# Patient Record
Sex: Female | Born: 1950 | Race: White | Hispanic: No | Marital: Married | Smoking: Former smoker
Health system: Southern US, Community
[De-identification: ages and names within clinical notes are randomized; demographics above are authoritative.]

## PROBLEM LIST (undated history)

## (undated) DIAGNOSIS — M329 Systemic lupus erythematosus, unspecified: Secondary | ICD-10-CM

## (undated) DIAGNOSIS — J449 Chronic obstructive pulmonary disease, unspecified: Secondary | ICD-10-CM

## (undated) DIAGNOSIS — K589 Irritable bowel syndrome without diarrhea: Secondary | ICD-10-CM

## (undated) DIAGNOSIS — E162 Hypoglycemia, unspecified: Secondary | ICD-10-CM

## (undated) DIAGNOSIS — E079 Disorder of thyroid, unspecified: Secondary | ICD-10-CM

## (undated) DIAGNOSIS — K219 Gastro-esophageal reflux disease without esophagitis: Secondary | ICD-10-CM

## (undated) DIAGNOSIS — IMO0002 Reserved for concepts with insufficient information to code with codable children: Secondary | ICD-10-CM

## (undated) HISTORY — PX: CHOLECYSTECTOMY: SHX55

## (undated) HISTORY — PX: BREAST SURGERY: SHX581

## (undated) HISTORY — PX: ABDOMINAL HYSTERECTOMY: SHX81

## (undated) HISTORY — PX: CYSTECTOMY: SUR359

---

## 2014-06-28 ENCOUNTER — Encounter (HOSPITAL_COMMUNITY): Payer: Self-pay

## 2014-06-28 ENCOUNTER — Emergency Department (HOSPITAL_COMMUNITY)
Admission: EM | Admit: 2014-06-28 | Discharge: 2014-06-28 | Disposition: A | Discharge status: Home / Self Care | Payer: Commercial Managed Care - PPO | Attending: Emergency Medicine | Admitting: Emergency Medicine

## 2014-06-28 ENCOUNTER — Emergency Department (HOSPITAL_COMMUNITY): Payer: Commercial Managed Care - PPO

## 2014-06-28 DIAGNOSIS — K219 Gastro-esophageal reflux disease without esophagitis: Secondary | ICD-10-CM | POA: Diagnosis not present

## 2014-06-28 DIAGNOSIS — Z880 Allergy status to penicillin: Secondary | ICD-10-CM | POA: Diagnosis not present

## 2014-06-28 DIAGNOSIS — E039 Hypothyroidism, unspecified: Secondary | ICD-10-CM | POA: Insufficient documentation

## 2014-06-28 DIAGNOSIS — Z87891 Personal history of nicotine dependence: Secondary | ICD-10-CM | POA: Insufficient documentation

## 2014-06-28 DIAGNOSIS — Z8739 Personal history of other diseases of the musculoskeletal system and connective tissue: Secondary | ICD-10-CM | POA: Insufficient documentation

## 2014-06-28 DIAGNOSIS — Z7982 Long term (current) use of aspirin: Secondary | ICD-10-CM | POA: Diagnosis not present

## 2014-06-28 DIAGNOSIS — Z9049 Acquired absence of other specified parts of digestive tract: Secondary | ICD-10-CM | POA: Insufficient documentation

## 2014-06-28 DIAGNOSIS — Z90710 Acquired absence of both cervix and uterus: Secondary | ICD-10-CM | POA: Insufficient documentation

## 2014-06-28 DIAGNOSIS — J449 Chronic obstructive pulmonary disease, unspecified: Secondary | ICD-10-CM | POA: Insufficient documentation

## 2014-06-28 DIAGNOSIS — Z79899 Other long term (current) drug therapy: Secondary | ICD-10-CM | POA: Insufficient documentation

## 2014-06-28 DIAGNOSIS — R1032 Left lower quadrant pain: Secondary | ICD-10-CM | POA: Diagnosis present

## 2014-06-28 DIAGNOSIS — K625 Hemorrhage of anus and rectum: Secondary | ICD-10-CM | POA: Diagnosis not present

## 2014-06-28 HISTORY — DX: Hypoglycemia, unspecified: E16.2

## 2014-06-28 HISTORY — DX: Systemic lupus erythematosus, unspecified: M32.9

## 2014-06-28 HISTORY — DX: Gastro-esophageal reflux disease without esophagitis: K21.9

## 2014-06-28 HISTORY — DX: Chronic obstructive pulmonary disease, unspecified: J44.9

## 2014-06-28 HISTORY — DX: Reserved for concepts with insufficient information to code with codable children: IMO0002

## 2014-06-28 HISTORY — DX: Irritable bowel syndrome, unspecified: K58.9

## 2014-06-28 HISTORY — DX: Disorder of thyroid, unspecified: E07.9

## 2014-06-28 LAB — URINE MICROSCOPIC-ADD ON: RBC / HPF: NONE SEEN RBC/hpf (ref <3)

## 2014-06-28 LAB — CBC
HCT: 40.1 % (ref 36.0–46.0)
HEMOGLOBIN: 12.9 g/dL (ref 12.0–15.0)
MCH: 27.4 pg (ref 26.0–34.0)
MCHC: 32.2 g/dL (ref 30.0–36.0)
MCV: 85.3 fL (ref 78.0–100.0)
Platelets: 204 10*3/uL (ref 150–400)
RBC: 4.7 MIL/uL (ref 3.87–5.11)
RDW: 13.2 % (ref 11.5–15.5)
WBC: 7.4 10*3/uL (ref 4.0–10.5)

## 2014-06-28 LAB — COMPREHENSIVE METABOLIC PANEL
ALK PHOS: 82 U/L (ref 38–126)
ALT: 28 U/L (ref 14–54)
AST: 27 U/L (ref 15–41)
Albumin: 4.1 g/dL (ref 3.5–5.0)
Anion gap: 8 (ref 5–15)
BILIRUBIN TOTAL: 0.5 mg/dL (ref 0.3–1.2)
BUN: 27 mg/dL — ABNORMAL HIGH (ref 6–20)
CO2: 27 mmol/L (ref 22–32)
Calcium: 9.4 mg/dL (ref 8.9–10.3)
Chloride: 105 mmol/L (ref 101–111)
Creatinine, Ser: 0.76 mg/dL (ref 0.44–1.00)
Glucose, Bld: 89 mg/dL (ref 65–99)
Potassium: 3.8 mmol/L (ref 3.5–5.1)
Sodium: 140 mmol/L (ref 135–145)
Total Protein: 6.9 g/dL (ref 6.5–8.1)

## 2014-06-28 LAB — ABO/RH: ABO/RH(D): A NEG

## 2014-06-28 LAB — URINALYSIS, ROUTINE W REFLEX MICROSCOPIC
BILIRUBIN URINE: NEGATIVE
Glucose, UA: NEGATIVE mg/dL
KETONES UR: NEGATIVE mg/dL
Leukocytes, UA: NEGATIVE
NITRITE: NEGATIVE
PROTEIN: NEGATIVE mg/dL
Specific Gravity, Urine: 1.007 (ref 1.005–1.030)
Urobilinogen, UA: 0.2 mg/dL (ref 0.0–1.0)
pH: 5 (ref 5.0–8.0)

## 2014-06-28 LAB — TYPE AND SCREEN
ABO/RH(D): A NEG
Antibody Screen: NEGATIVE

## 2014-06-28 LAB — I-STAT CG4 LACTIC ACID, ED: Lactic Acid, Venous: 0.54 mmol/L (ref 0.5–2.0)

## 2014-06-28 MED ORDER — IOHEXOL 300 MG/ML  SOLN
80.0000 mL | Freq: Once | INTRAMUSCULAR | Status: AC | PRN
Start: 2014-06-28 — End: 2014-06-28
  Administered 2014-06-28: 80 mL via INTRAVENOUS

## 2014-06-28 MED ORDER — IOHEXOL 300 MG/ML  SOLN
25.0000 mL | Freq: Once | INTRAMUSCULAR | Status: AC | PRN
  Administered 2014-06-28: 25 mL via ORAL

## 2014-06-28 NOTE — ED Provider Notes (Signed)
CSN: [id number]     Arrival date & time 06/28/14  1158 History   First MD Initiated Contact with Patient 06/28/14 1314     Chief Complaint  Patient presents with  . Rectal Bleeding     (Consider location/radiation/quality/duration/timing/severity/associated sxs/prior Treatment) Patient is a 64 y.o. female presenting with hematochezia. The history is provided by the patient.  Rectal Bleeding Quality:  Bright red and maroon Amount:  Moderate Duration: 3 episodes. Timing:  Intermittent Progression:  Resolved Chronicity:  New Context: hemorrhoids   Context: not constipation, not diarrhea, not rectal injury and not rectal pain   Similar prior episodes: no   Relieved by:  None tried Worsened by:  Nothing tried Ineffective treatments:  None tried Associated symptoms: abdominal pain   Associated symptoms: no dizziness, no fever, no hematemesis, no light-headedness, no loss of consciousness, no recent illness and no vomiting   Abdominal pain:    Location:  LLQ   Quality:  Dull   Severity:  Mild   Onset quality:  Gradual   Duration:  1 day   Timing:  Constant   Progression:  Unchanged   Chronicity:  New Risk factors: no anticoagulant use     Past Medical History  Diagnosis Date  . IBS (irritable bowel syndrome)   . COPD (chronic obstructive pulmonary disease)   . GERD (gastroesophageal reflux disease)   . Lupus   . Thyroid disease     hypothyroid  . Hypoglycemia    Past Surgical History  Procedure Laterality Date  . Cholecystectomy    . Abdominal hysterectomy    . Cystectomy    . Breast surgery Left     lump removed   No family history on file. History  Substance Use Topics  . Smoking status: Former Games developer  . Smokeless tobacco: Not on file  . Alcohol Use: No   OB History    No data available     Review of Systems  Constitutional: Negative for fever, chills, diaphoresis, appetite change and fatigue.  Respiratory: Negative for cough, chest tightness and  shortness of breath.   Cardiovascular: Negative for chest pain, palpitations and leg swelling.  Gastrointestinal: Positive for abdominal pain, blood in stool and hematochezia. Negative for nausea, vomiting, diarrhea, constipation, abdominal distention, rectal pain and hematemesis.  Genitourinary: Negative for dysuria, frequency, hematuria, flank pain and difficulty urinating.  Musculoskeletal: Negative for back pain, neck pain and neck stiffness.  Skin: Negative for color change, pallor and rash.  Neurological: Negative for dizziness, loss of consciousness, syncope, weakness, light-headedness, numbness and headaches.  All other systems reviewed and are negative.     Allergies  Penicillins  Home Medications   Prior to Admission medications   Medication Sig Start Date End Date Taking? Authorizing Provider  aspirin 325 MG tablet Take 325 mg by mouth daily.   Yes Historical Provider, MD  hydroxychloroquine (PLAQUENIL) 200 MG tablet Take 200 mg by mouth daily.   Yes Historical Provider, MD  hydrOXYzine (ATARAX/VISTARIL) 25 MG tablet Take 25 mg by mouth 2 (two) times daily.   Yes Historical Provider, MD  lansoprazole (PREVACID) 30 MG capsule Take 30 mg by mouth daily at 12 noon.   Yes Historical Provider, MD  levothyroxine (SYNTHROID, LEVOTHROID) 100 MCG tablet Take 100 mcg by mouth daily before breakfast.   Yes Historical Provider, MD  MAGNESIUM PO Take 1 tablet by mouth daily.   Yes Historical Provider, MD  Multiple Vitamins-Minerals (MULTIVITAMIN GUMMIES ADULT PO) Take 1 tablet by mouth daily.  Yes Historical Provider, MD  OVER THE COUNTER MEDICATION Take 2 tablets by mouth daily.   Yes Historical Provider, MD  simvastatin (ZOCOR) 40 MG tablet Take 40 mg by mouth daily.   Yes Historical Provider, MD   BP 120/55 mmHg  Pulse 77  Temp(Src) 97.8 F (36.6 C) (Oral)  Resp 20  Ht 5\' 4"  (1.626 m)  Wt 155 lb (70.308 kg)  BMI 26.59 kg/m2  SpO2 98% Physical Exam  Constitutional: She is  oriented to person, place, and time. She appears well-developed and well-nourished. No distress.  HENT:  Head: Normocephalic and atraumatic.  Mouth/Throat: Oropharynx is clear and moist.  Eyes: Conjunctivae and EOM are normal. Pupils are equal, round, and reactive to light.  Neck: Normal range of motion. Neck supple.  Cardiovascular: Normal rate, regular rhythm, normal heart sounds and intact distal pulses.  Exam reveals no gallop and no friction rub.   No murmur heard. Pulmonary/Chest: Effort normal and breath sounds normal. No respiratory distress. She has no wheezes. She has no rales.  Abdominal: Soft. Normal appearance and bowel sounds are normal. There is tenderness in the left lower quadrant. There is no rigidity, no rebound, no guarding, no CVA tenderness, no tenderness at McBurney's point and negative Murphy's sign.  Genitourinary: Rectal exam shows external hemorrhoid. Rectal exam shows no fissure and no tenderness.  external hemorrhoids present without fissures, thrombosed hemorrhoids or TTP Pt declines DRE  Musculoskeletal: Normal range of motion. She exhibits no edema or tenderness.  Neurological: She is alert and oriented to person, place, and time. GCS eye subscore is 4. GCS verbal subscore is 5. GCS motor subscore is 6.  Skin: Skin is warm and dry. No rash noted. She is not diaphoretic. No erythema. No pallor.  Nursing note and vitals reviewed.   ED Course  Procedures (including critical care time) Labs Review Labs Reviewed  COMPREHENSIVE METABOLIC PANEL - Abnormal; Notable for the following:    BUN 27 (*)    All other components within normal limits  URINALYSIS, ROUTINE W REFLEX MICROSCOPIC - Abnormal; Notable for the following:    Hgb urine dipstick SMALL (*)    All other components within normal limits  CBC  URINE MICROSCOPIC-ADD ON  I-STAT CG4 LACTIC ACID, ED  POC OCCULT BLOOD, ED  TYPE AND SCREEN  ABO/RH    Imaging Review Ct Abdomen Pelvis W  Contrast  06/28/2014   CLINICAL DATA:  Lower abdominal pain.  Bloody stools today.  EXAM: CT ABDOMEN AND PELVIS WITH CONTRAST  TECHNIQUE: Multidetector CT imaging of the abdomen and pelvis was performed using the standard protocol following bolus administration of intravenous contrast.  CONTRAST:  100 mL OMNIPAQUE IOHEXOL 300 MG/ML  SOLN  COMPARISON:  None.  FINDINGS: Gallbladder has been removed. Liver parenchyma is normal. Slight dilatation of the biliary tree with a maximum diameter of the common bile duct of 10 mm, normal for a postcholecystectomy patient.  The spleen, pancreas, adrenal glands, and right kidney are normal. Left kidney is normal except for a 1.7 cm cyst on the lower pole.  There are few small diverticula in the distal sigmoid portion of the colon. The bowel otherwise appears normal including the terminal ileum and appendix. There is moderate calcification in the abdominal aorta. Uterus and right ovary have been removed. Normal left ovary. Bladder appears normal. No adenopathy or mass lesions. No free air or free fluid. No acute osseous abnormality.  IMPRESSION: No acute abnormalities. Minimal diverticulosis of the distal sigmoid colon.  Electronically Signed   By: Francene BoyersJames  Maxwell M.D.   On: 06/28/2014 16:21     EKG Interpretation None      MDM   Final diagnoses:  Rectal bleed    64 yo F with PMH of IBS, hemorrhoids, COPD, hypothyroidism, lupus, presenting with rectal bleeding.  Pt reports 3 episodes of bloody stools today, described as bright red blood mixed in with stool.  No diarrhea, melena.  +associated LLQ abdominal pain.  Denies fevers, N/V/D, dysuria.  Denies lightheadedness, chest pain, SOB. Reports she has not had issues with hemorrhoids in several years since she started taking a stool softener.  Feels different from when she had hemorrhoids as she was not straining, had no rectal pain, and did not feel them "pop out" when stooling.  Denies anti-coagulant use.  On  presentation, pt alert, AFVSS, in NAD. CV and lung exam WNL.  Abdomen soft, mild TTP in LLQ without peritoneal signs. Pt declined DRE but willing to have external rectal exam performed- external hemorrhoids present without fissures, thrombosed hemorrhoids or TTP. No other acute findings.  Possible diverticulitis or diverticulosis, vs infectious or inflammatory colitis.  Plan for labs, CT A/P.  Lab results WNL, no anemia, normal lactate and electrolytes.  16:00:  Care of pt to Dr. Arlie SolomonsPost, CT A/P pending.  Stable at time of transfer.  Discussed with attending Dr. Lynelle DoctorKnapp.    Jodean LimaEmily Waynesha Rammel, MD 06/28/14 [id number]  Linwood DibblesJon Knapp, MD 07/01/14 [phone]

## 2014-06-28 NOTE — ED Notes (Signed)
Ct notified that pt finished drinking her contrast.

## 2014-06-28 NOTE — ED Notes (Signed)
MD at bedside. 

## 2014-06-28 NOTE — ED Notes (Signed)
Pt has noticed this morning she had three bloody stools. The first one was bright red and only noticed it on the tissue paper, the second and third was dark red and noticed it in the bowl. Has IBS and hemorrhoids and takes a medication to soften her stools and has never had any problems with her stools from the medications. Has some lower abd pain. [place]

## 2014-06-28 NOTE — ED Notes (Signed)
Patient transported to CT 

## 2014-06-28 NOTE — ED Provider Notes (Signed)
4:13 PM Patient care transfer from Dr. Artis FlockWolfe. 64 year old female with 3 episodes of bloody diarrhea this afternoon. Does have some mild abdominal discomfort however physical exam is otherwise unremarkable. Labs showed normal hemoglobin. CMP unremarkable. Awaiting CT abdomen and pelvis to rule out diverticulitis or other.  4:49 PM CT abdomen and pelvis without any significant findings. Did have some diverticulosis however however no diverticulitis. Reevaluation patient continues to be stable. No significant abdominal pain on my exam. We will recommend patient follow up with her PCP for a colonoscopy within the next several days. Patient is from [place] We've also provided her number for physicians in this area if she will be here for longer term. Discussed return precautions with the patient. She is in agreement with plan. Discharged without further issue.  Bridgett Larssonhris Migdalia Olejniczak, MD 06/28/14 [phone]

## 2014-09-08 ENCOUNTER — Emergency Department (HOSPITAL_COMMUNITY)
Admission: EM | Admit: 2014-09-08 | Discharge: 2014-09-08 | Disposition: A | Discharge status: Home / Self Care | Payer: Commercial Managed Care - PPO | Attending: Emergency Medicine | Admitting: Emergency Medicine

## 2014-09-08 ENCOUNTER — Emergency Department (HOSPITAL_COMMUNITY): Payer: Commercial Managed Care - PPO

## 2014-09-08 ENCOUNTER — Encounter (HOSPITAL_COMMUNITY): Payer: Self-pay

## 2014-09-08 DIAGNOSIS — R1013 Epigastric pain: Secondary | ICD-10-CM | POA: Diagnosis not present

## 2014-09-08 DIAGNOSIS — Z7982 Long term (current) use of aspirin: Secondary | ICD-10-CM | POA: Diagnosis not present

## 2014-09-08 DIAGNOSIS — R1011 Right upper quadrant pain: Secondary | ICD-10-CM | POA: Diagnosis not present

## 2014-09-08 DIAGNOSIS — Z9049 Acquired absence of other specified parts of digestive tract: Secondary | ICD-10-CM | POA: Insufficient documentation

## 2014-09-08 DIAGNOSIS — Z90710 Acquired absence of both cervix and uterus: Secondary | ICD-10-CM | POA: Insufficient documentation

## 2014-09-08 DIAGNOSIS — R109 Unspecified abdominal pain: Secondary | ICD-10-CM | POA: Diagnosis present

## 2014-09-08 DIAGNOSIS — K219 Gastro-esophageal reflux disease without esophagitis: Secondary | ICD-10-CM | POA: Insufficient documentation

## 2014-09-08 DIAGNOSIS — Z87891 Personal history of nicotine dependence: Secondary | ICD-10-CM | POA: Insufficient documentation

## 2014-09-08 DIAGNOSIS — R1012 Left upper quadrant pain: Secondary | ICD-10-CM | POA: Insufficient documentation

## 2014-09-08 DIAGNOSIS — Z880 Allergy status to penicillin: Secondary | ICD-10-CM | POA: Insufficient documentation

## 2014-09-08 DIAGNOSIS — Z872 Personal history of diseases of the skin and subcutaneous tissue: Secondary | ICD-10-CM | POA: Diagnosis not present

## 2014-09-08 DIAGNOSIS — E039 Hypothyroidism, unspecified: Secondary | ICD-10-CM | POA: Insufficient documentation

## 2014-09-08 DIAGNOSIS — Z79899 Other long term (current) drug therapy: Secondary | ICD-10-CM | POA: Diagnosis not present

## 2014-09-08 DIAGNOSIS — J449 Chronic obstructive pulmonary disease, unspecified: Secondary | ICD-10-CM | POA: Diagnosis not present

## 2014-09-08 DIAGNOSIS — R197 Diarrhea, unspecified: Secondary | ICD-10-CM

## 2014-09-08 LAB — CBC
HEMATOCRIT: 38 % (ref 36.0–46.0)
Hemoglobin: 12.6 g/dL (ref 12.0–15.0)
MCH: 27.9 pg (ref 26.0–34.0)
MCHC: 33.2 g/dL (ref 30.0–36.0)
MCV: 84.3 fL (ref 78.0–100.0)
PLATELETS: 182 10*3/uL (ref 150–400)
RBC: 4.51 MIL/uL (ref 3.87–5.11)
RDW: 14.3 % (ref 11.5–15.5)
WBC: 13.9 10*3/uL — ABNORMAL HIGH (ref 4.0–10.5)

## 2014-09-08 LAB — URINE MICROSCOPIC-ADD ON

## 2014-09-08 LAB — COMPREHENSIVE METABOLIC PANEL
ALK PHOS: 81 U/L (ref 38–126)
ALT: 32 U/L (ref 14–54)
ANION GAP: 9 (ref 5–15)
AST: 34 U/L (ref 15–41)
Albumin: 3.9 g/dL (ref 3.5–5.0)
BUN: 23 mg/dL — AB (ref 6–20)
CO2: 26 mmol/L (ref 22–32)
CREATININE: 0.79 mg/dL (ref 0.44–1.00)
Calcium: 9.1 mg/dL (ref 8.9–10.3)
Chloride: 103 mmol/L (ref 101–111)
Glucose, Bld: 99 mg/dL (ref 65–99)
POTASSIUM: 3.8 mmol/L (ref 3.5–5.1)
Sodium: 138 mmol/L (ref 135–145)
Total Bilirubin: 0.7 mg/dL (ref 0.3–1.2)
Total Protein: 6.4 g/dL — ABNORMAL LOW (ref 6.5–8.1)

## 2014-09-08 LAB — URINALYSIS, ROUTINE W REFLEX MICROSCOPIC
Bilirubin Urine: NEGATIVE
Glucose, UA: NEGATIVE mg/dL
KETONES UR: NEGATIVE mg/dL
LEUKOCYTES UA: NEGATIVE
Nitrite: NEGATIVE
Protein, ur: NEGATIVE mg/dL
Specific Gravity, Urine: 1.013 (ref 1.005–1.030)
Urobilinogen, UA: 0.2 mg/dL (ref 0.0–1.0)
pH: 6 (ref 5.0–8.0)

## 2014-09-08 LAB — LIPASE, BLOOD: LIPASE: 33 U/L (ref 22–51)

## 2014-09-08 MED ORDER — ONDANSETRON HCL 4 MG/2ML IJ SOLN
4.0000 mg | Freq: Once | INTRAMUSCULAR | Status: AC
  Administered 2014-09-08: 4 mg via INTRAVENOUS
  Filled 2014-09-08: qty 2

## 2014-09-08 MED ORDER — LOPERAMIDE HCL 2 MG PO CAPS: 2.0000 mg | ORAL_CAPSULE | Freq: Four times a day (QID) | ORAL | Status: AC | PRN

## 2014-09-08 MED ORDER — IOHEXOL 300 MG/ML  SOLN
100.0000 mL | Freq: Once | INTRAMUSCULAR | Status: AC | PRN
  Administered 2014-09-08: 100 mL via INTRAVENOUS

## 2014-09-08 MED ORDER — IOHEXOL 300 MG/ML  SOLN
25.0000 mL | Freq: Once | INTRAMUSCULAR | Status: AC | PRN
  Administered 2014-09-08: 25 mL via ORAL

## 2014-09-08 MED ORDER — SUCRALFATE 1 G PO TABS: 1.0000 g | ORAL_TABLET | Freq: Three times a day (TID) | ORAL | Status: DC

## 2014-09-08 MED ORDER — ONDANSETRON 4 MG PO TBDP
4.0000 mg | ORAL_TABLET | Freq: Once | ORAL | Status: AC | PRN
  Administered 2014-09-08: 4 mg via ORAL

## 2014-09-08 MED ORDER — MORPHINE SULFATE 2 MG/ML IJ SOLN
2.0000 mg | Freq: Once | INTRAMUSCULAR | Status: AC
  Administered 2014-09-08: 2 mg via INTRAVENOUS
  Filled 2014-09-08: qty 1

## 2014-09-08 MED ORDER — SODIUM CHLORIDE 0.9 % IV SOLN: 1000.0000 mL | INTRAVENOUS | Status: DC

## 2014-09-08 MED ORDER — ONDANSETRON 8 MG PO TBDP: 8.0000 mg | ORAL_TABLET | Freq: Three times a day (TID) | ORAL | Status: AC | PRN

## 2014-09-08 MED ORDER — SODIUM CHLORIDE 0.9 % IV SOLN
1000.0000 mL | Freq: Once | INTRAVENOUS | Status: AC
  Administered 2014-09-08: 1000 mL via INTRAVENOUS

## 2014-09-08 MED ORDER — ONDANSETRON 4 MG PO TBDP
ORAL_TABLET | ORAL | Status: AC
  Filled 2014-09-08: qty 1

## 2014-09-08 NOTE — ED Notes (Signed)
Patient transported to CT 

## 2014-09-08 NOTE — Discharge Instructions (Signed)

## 2014-09-08 NOTE — ED Provider Notes (Signed)
CSN: [id number]     Arrival date & time 09/08/14  1318 History   First MD Initiated Contact with Patient 09/08/14 1601     No chief complaint on file.   HPI Patient presents to the emergency room with complaints of multiple episodes of diarrhea associated with upper abdominal cramping. Patient has had some nausea but limited vomiting. She's had too numerous to count episodes of watery diarrhea. She denies any blood. The pain is rating towards the back. At times it was severe although it has improved while she was waiting to be seen. She denies any dysuria. No known fevers. No blood in her stool  Past Medical History  Diagnosis Date  . IBS (irritable bowel syndrome)   . COPD (chronic obstructive pulmonary disease)   . GERD (gastroesophageal reflux disease)   . Lupus   . Thyroid disease     hypothyroid  . Hypoglycemia    Past Surgical History  Procedure Laterality Date  . Cholecystectomy    . Abdominal hysterectomy    . Cystectomy    . Breast surgery Left     lump removed   History reviewed. No pertinent family history. History  Substance Use Topics  . Smoking status: Former Games developer  . Smokeless tobacco: Not on file  . Alcohol Use: No   OB History    No data available     Review of Systems  All other systems reviewed and are negative.     Allergies  Penicillins  Home Medications   Prior to Admission medications   Medication Sig Start Date End Date Taking? Authorizing Provider  acetaminophen (TYLENOL) 650 MG CR tablet Take 650 mg by mouth every 8 (eight) hours as needed for pain.   Yes Historical Provider, MD  aspirin 325 MG tablet Take 325 mg by mouth at bedtime.    Yes Historical Provider, MD  hydroxychloroquine (PLAQUENIL) 200 MG tablet Take 200 mg by mouth daily.   Yes Historical Provider, MD  hydrOXYzine (ATARAX/VISTARIL) 25 MG tablet Take 25 mg by mouth every 4 (four) hours as needed for itching.    Yes Historical Provider, MD  ibandronate (BONIVA) 150 MG tablet  Take 150 mg by mouth every 30 (thirty) days. Take in the morning with a full glass of water, on an empty stomach, and do not take anything else by mouth or lie down for the next 30 min.   Yes Historical Provider, MD  lansoprazole (PREVACID) 30 MG capsule Take 30 mg by mouth daily at 12 noon.   Yes Historical Provider, MD  levothyroxine (SYNTHROID, LEVOTHROID) 100 MCG tablet Take 100 mcg by mouth daily before breakfast.   Yes Historical Provider, MD  MAGNESIUM PO Take 1 tablet by mouth at bedtime.    Yes Historical Provider, MD  Multiple Vitamins-Minerals (MULTIVITAMIN GUMMIES ADULT PO) Take 1 tablet by mouth at bedtime.    Yes Historical Provider, MD  OVER THE COUNTER MEDICATION Take 1 tablet by mouth 2 (two) times daily.    Yes Historical Provider, MD  ranitidine (ZANTAC) 300 MG tablet Take 150 mg by mouth daily as needed for heartburn (acid reflux).   Yes Historical Provider, MD  simvastatin (ZOCOR) 40 MG tablet Take 40 mg by mouth daily at 6 PM.    Yes Historical Provider, MD  loperamide (IMODIUM) 2 MG capsule Take 1 capsule (2 mg total) by mouth 4 (four) times daily as needed for diarrhea or loose stools. 09/08/14   Linwood Dibbles, MD  ondansetron (ZOFRAN ODT) 8  MG disintegrating tablet Take 1 tablet (8 mg total) by mouth every 8 (eight) hours as needed for nausea or vomiting. 09/08/14   Linwood Dibbles, MD  sucralfate (CARAFATE) 1 G tablet Take 1 tablet (1 g total) by mouth 4 (four) times daily -  with meals and at bedtime. 09/08/14   Linwood Dibbles, MD   BP 121/53 mmHg  Pulse 83  Temp(Src) 98.2 F (36.8 C) (Oral)  Resp 20  Ht 5\' 4"  (1.626 m)  Wt 154 lb (69.854 kg)  BMI 26.42 kg/m2  SpO2 96% Physical Exam  Constitutional: She appears well-developed and well-nourished. No distress.  HENT:  Head: Normocephalic and atraumatic.  Right Ear: External ear normal.  Left Ear: External ear normal.  Eyes: Conjunctivae are normal. Right eye exhibits no discharge. Left eye exhibits no discharge. No scleral icterus.   Neck: Neck supple. No tracheal deviation present.  Cardiovascular: Normal rate, regular rhythm and intact distal pulses.   Pulmonary/Chest: Effort normal and breath sounds normal. No stridor. No respiratory distress. She has no wheezes. She has no rales.  Abdominal: Soft. Bowel sounds are normal. She exhibits no distension. There is tenderness in the right upper quadrant, epigastric area and left upper quadrant. There is no rebound and no guarding.  Musculoskeletal: She exhibits no edema or tenderness.  Neurological: She is alert. She has normal strength. No cranial nerve deficit (no facial droop, extraocular movements intact, no slurred speech) or sensory deficit. She exhibits normal muscle tone. She displays no seizure activity. Coordination normal.  Skin: Skin is warm and dry. No rash noted.  Psychiatric: She has a normal mood and affect.  Nursing note and vitals reviewed.   ED Course  Procedures (including critical care time) Labs Review Labs Reviewed  COMPREHENSIVE METABOLIC PANEL - Abnormal; Notable for the following:    BUN 23 (*)    Total Protein 6.4 (*)    All other components within normal limits  CBC - Abnormal; Notable for the following:    WBC 13.9 (*)    All other components within normal limits  URINALYSIS, ROUTINE W REFLEX MICROSCOPIC (NOT AT [facility]) - Abnormal; Notable for the following:    Hgb urine dipstick SMALL (*)    All other components within normal limits  LIPASE, BLOOD  URINE MICROSCOPIC-ADD ON    Imaging Review Ct Abdomen Pelvis W Contrast  09/08/2014   CLINICAL DATA:  Abdominal pain.  Nausea, vomiting, and diarrhea.  EXAM: CT ABDOMEN AND PELVIS WITH CONTRAST  TECHNIQUE: Multidetector CT imaging of the abdomen and pelvis was performed using the standard protocol following bolus administration of intravenous contrast.  CONTRAST:  OMNIPAQUE IOHEXOL 300 MG/ML  SOLN  COMPARISON:  06/28/2014  FINDINGS: Lower chest: Lung bases are clear. Heart size is normal.  Small hiatal hernia.  Hepatobiliary: Chronic dilatation of the biliary tree. The patient's bilirubin is normal. Liver parenchyma is normal.  Pancreas: Slight chronic prominence of the pancreatic duct. Pancreas is otherwise normal.  Spleen: Normal.  Adrenals/Urinary Tract: Stable 15 mm cyst on the lower pole of the left kidney. The bladder is slightly distended. Otherwise normal.  Stomach/Bowel: There is slight edema of the mucosa of the distal antrum and pylorus and duodenal bulb. Otherwise the bowel appears normal.  Vascular/Lymphatic: Aortic atherosclerosis.  No adenopathy.  Reproductive: Uterus and right ovary have been removed. Left ovary is normal.  Other: No free air or free fluid.  Musculoskeletal: No acute osseous abnormalities.  IMPRESSION: Edema of the mucosa of the distal antrum  and pylorus and duodenal bulb. This is similar to the appearance on the prior scan. Otherwise, benign appearing abdomen.   Electronically Signed   By: Francene Boyers M.D.   On: 09/08/2014 17:45    Medications  ondansetron (ZOFRAN-ODT) 4 MG disintegrating tablet (not administered)  0.9 %  sodium chloride infusion (0 mLs Intravenous Stopped 09/08/14 1827)    Followed by  0.9 %  sodium chloride infusion (not administered)  ondansetron (ZOFRAN) injection 4 mg (not administered)  ondansetron (ZOFRAN-ODT) disintegrating tablet 4 mg (4 mg Oral Given 09/08/14 1333)  ondansetron (ZOFRAN) injection 4 mg (4 mg Intravenous Given 09/08/14 1640)  morphine 2 MG/ML injection 2 mg (2 mg Intravenous Given 09/08/14 1640)  iohexol (OMNIPAQUE) 300 MG/ML solution 25 mL (25 mLs Oral Contrast Given 09/08/14 1646)  iohexol (OMNIPAQUE) 300 MG/ML solution 100 mL (100 mLs Intravenous Contrast Given 09/08/14 1721)     MDM   Final diagnoses:  Abdominal pain, unspecified abdominal location  Diarrhea    Pt's ct scan shows persistent inflammation in her upper gi tract.  Patient states since the prior CT scan she did see a GI doctor and have an  upper GI evaluation. I do not think that this would cause all of the diarrhea that she experienced. She does artery on antiacids. I'll add Carafate. I will give her Imodium for her diarrhea. Unfortunately she's not showing any signs of any serious infection or obstruction. Patient is stable for outpatient follow-up.    Linwood Dibbles, MD 09/08/14 [phone]

## 2014-09-08 NOTE — ED Notes (Signed)
Pt presents with onset of L sided abdominal pain that began last night.  +nausea and diarrhea;  Pt reports pain has been constant and radiates around to L back

## 2014-09-09 ENCOUNTER — Emergency Department (HOSPITAL_COMMUNITY)
Admission: EM | Admit: 2014-09-09 | Discharge: 2014-09-09 | Disposition: A | Discharge status: Home / Self Care | Payer: Commercial Managed Care - PPO | Attending: Emergency Medicine | Admitting: Emergency Medicine

## 2014-09-09 ENCOUNTER — Encounter (HOSPITAL_COMMUNITY): Payer: Self-pay | Admitting: Emergency Medicine

## 2014-09-09 DIAGNOSIS — R1013 Epigastric pain: Secondary | ICD-10-CM | POA: Insufficient documentation

## 2014-09-09 DIAGNOSIS — Z79899 Other long term (current) drug therapy: Secondary | ICD-10-CM | POA: Diagnosis not present

## 2014-09-09 DIAGNOSIS — J449 Chronic obstructive pulmonary disease, unspecified: Secondary | ICD-10-CM | POA: Diagnosis not present

## 2014-09-09 DIAGNOSIS — Z7982 Long term (current) use of aspirin: Secondary | ICD-10-CM | POA: Diagnosis not present

## 2014-09-09 DIAGNOSIS — R112 Nausea with vomiting, unspecified: Secondary | ICD-10-CM | POA: Insufficient documentation

## 2014-09-09 DIAGNOSIS — E039 Hypothyroidism, unspecified: Secondary | ICD-10-CM | POA: Diagnosis not present

## 2014-09-09 DIAGNOSIS — Z87891 Personal history of nicotine dependence: Secondary | ICD-10-CM | POA: Insufficient documentation

## 2014-09-09 DIAGNOSIS — Z90710 Acquired absence of both cervix and uterus: Secondary | ICD-10-CM | POA: Diagnosis not present

## 2014-09-09 DIAGNOSIS — Z9049 Acquired absence of other specified parts of digestive tract: Secondary | ICD-10-CM | POA: Diagnosis not present

## 2014-09-09 DIAGNOSIS — K219 Gastro-esophageal reflux disease without esophagitis: Secondary | ICD-10-CM | POA: Insufficient documentation

## 2014-09-09 DIAGNOSIS — Z872 Personal history of diseases of the skin and subcutaneous tissue: Secondary | ICD-10-CM | POA: Insufficient documentation

## 2014-09-09 DIAGNOSIS — Z880 Allergy status to penicillin: Secondary | ICD-10-CM | POA: Diagnosis not present

## 2014-09-09 LAB — COMPREHENSIVE METABOLIC PANEL
ALBUMIN: 3.6 g/dL (ref 3.5–5.0)
ALT: 33 U/L (ref 14–54)
AST: 29 U/L (ref 15–41)
Alkaline Phosphatase: 74 U/L (ref 38–126)
Anion gap: 8 (ref 5–15)
BUN: 13 mg/dL (ref 6–20)
CHLORIDE: 107 mmol/L (ref 101–111)
CO2: 24 mmol/L (ref 22–32)
Calcium: 8.5 mg/dL — ABNORMAL LOW (ref 8.9–10.3)
Creatinine, Ser: 0.81 mg/dL (ref 0.44–1.00)
GFR calc Af Amer: >60 mL/min (ref >60)
Glucose, Bld: 112 mg/dL — ABNORMAL HIGH (ref 65–99)
Potassium: 3.6 mmol/L (ref 3.5–5.1)
SODIUM: 139 mmol/L (ref 135–145)
TOTAL PROTEIN: 6 g/dL — AB (ref 6.5–8.1)
Total Bilirubin: 0.8 mg/dL (ref 0.3–1.2)

## 2014-09-09 LAB — CBC WITH DIFFERENTIAL/PLATELET
Basophils Absolute: 0 10*3/uL (ref 0.0–0.1)
Basophils Relative: 0 % (ref 0–1)
EOS ABS: 0 10*3/uL (ref 0.0–0.7)
EOS PCT: 0 % (ref 0–5)
HEMATOCRIT: 34.7 % — AB (ref 36.0–46.0)
Hemoglobin: 11.6 g/dL — ABNORMAL LOW (ref 12.0–15.0)
LYMPHS PCT: 4 % — AB (ref 12–46)
Lymphs Abs: 0.5 10*3/uL — ABNORMAL LOW (ref 0.7–4.0)
MCH: 28.2 pg (ref 26.0–34.0)
MCHC: 33.4 g/dL (ref 30.0–36.0)
MCV: 84.4 fL (ref 78.0–100.0)
MONO ABS: 1.1 10*3/uL — AB (ref 0.1–1.0)
Monocytes Relative: 10 % (ref 3–12)
NEUTROS PCT: 86 % — AB (ref 43–77)
Neutro Abs: 9.4 10*3/uL — ABNORMAL HIGH (ref 1.7–7.7)
Platelets: 148 10*3/uL — ABNORMAL LOW (ref 150–400)
RBC: 4.11 MIL/uL (ref 3.87–5.11)
RDW: 14.5 % (ref 11.5–15.5)
WBC: 11 10*3/uL — ABNORMAL HIGH (ref 4.0–10.5)

## 2014-09-09 LAB — LIPASE, BLOOD: Lipase: 27 U/L (ref 22–51)

## 2014-09-09 LAB — I-STAT CG4 LACTIC ACID, ED: Lactic Acid, Venous: 0.66 mmol/L (ref 0.5–2.0)

## 2014-09-09 MED ORDER — ONDANSETRON 4 MG PO TBDP
ORAL_TABLET | ORAL | Status: AC
  Filled 2014-09-09: qty 1

## 2014-09-09 MED ORDER — ONDANSETRON HCL 4 MG/2ML IJ SOLN
4.0000 mg | Freq: Once | INTRAMUSCULAR | Status: AC
  Administered 2014-09-09: 4 mg via INTRAVENOUS
  Filled 2014-09-09: qty 2

## 2014-09-09 MED ORDER — PANTOPRAZOLE SODIUM 40 MG IV SOLR
40.0000 mg | Freq: Once | INTRAVENOUS | Status: AC
  Administered 2014-09-09: 40 mg via INTRAVENOUS
  Filled 2014-09-09: qty 40

## 2014-09-09 MED ORDER — OXYCODONE-ACETAMINOPHEN 5-325 MG PO TABS: 1.0000 | ORAL_TABLET | ORAL | Status: DC | PRN

## 2014-09-09 MED ORDER — SODIUM CHLORIDE 0.9 % IV SOLN
1000.0000 mL | INTRAVENOUS | Status: DC
  Administered 2014-09-09: 1000 mL via INTRAVENOUS

## 2014-09-09 MED ORDER — ONDANSETRON 4 MG PO TBDP
4.0000 mg | ORAL_TABLET | Freq: Once | ORAL | Status: AC | PRN
  Administered 2014-09-09: 4 mg via ORAL

## 2014-09-09 MED ORDER — SODIUM CHLORIDE 0.9 % IV SOLN
1000.0000 mL | Freq: Once | INTRAVENOUS | Status: AC
  Administered 2014-09-09: 1000 mL via INTRAVENOUS

## 2014-09-09 MED ORDER — MORPHINE SULFATE 4 MG/ML IJ SOLN
4.0000 mg | Freq: Once | INTRAMUSCULAR | Status: AC
  Administered 2014-09-09: 4 mg via INTRAVENOUS
  Filled 2014-09-09: qty 1

## 2014-09-09 MED ORDER — METOCLOPRAMIDE HCL 5 MG/ML IJ SOLN
10.0000 mg | Freq: Once | INTRAMUSCULAR | Status: AC
  Administered 2014-09-09: 10 mg via INTRAVENOUS
  Filled 2014-09-09: qty 2

## 2014-09-09 MED ORDER — LANSOPRAZOLE 30 MG PO CPDR: 30.0000 mg | DELAYED_RELEASE_CAPSULE | Freq: Every day | ORAL | Status: AC

## 2014-09-09 NOTE — ED Notes (Signed)
The patient said she was seen here for ab pain and diarrhea.  She is back because now she is vomiting and is having abdominal pain.   The patient denies any diarrhea now, but cannot stop vomiting and coughing.  She says she cannot catch her breath due to her vomiting.  She does have COPD.

## 2014-09-09 NOTE — ED Provider Notes (Signed)
CSN: [id number]     Arrival date & time 09/09/14  0315 History   First MD Initiated Contact with Patient 09/09/14 [phone]     Chief Complaint  Patient presents with  . Abdominal Pain    The patient said she was seen here for ab pain and diarrhea.  She is back because now she is vomiting and is having abdominal pain.      (Consider location/radiation/quality/duration/timing/severity/associated sxs/prior Treatment) Patient is a 64 y.o. female presenting with abdominal pain. The history is provided by the patient.  Abdominal Pain She had been in the emergency department yesterday with crampy abdominal pain, nausea, dry heaves, and diarrhea. She felt better when she went home, but woke up at 1 AM with recurrent epigastric pain, nausea, vomiting. She is also when fevers which went up to 101.9 and then came down spontaneously. She is not having anymore diarrhea. She rates pain at 10/10. Pain is in the epigastric area and does not radiate.  Past Medical History  Diagnosis Date  . IBS (irritable bowel syndrome)   . COPD (chronic obstructive pulmonary disease)   . GERD (gastroesophageal reflux disease)   . Lupus   . Thyroid disease     hypothyroid  . Hypoglycemia    Past Surgical History  Procedure Laterality Date  . Cholecystectomy    . Abdominal hysterectomy    . Cystectomy    . Breast surgery Left     lump removed   History reviewed. No pertinent family history. History  Substance Use Topics  . Smoking status: Former Games developer  . Smokeless tobacco: Not on file  . Alcohol Use: No   OB History    No data available     Review of Systems  Gastrointestinal: Positive for abdominal pain.  All other systems reviewed and are negative.     Allergies  Penicillins  Home Medications   Prior to Admission medications   Medication Sig Start Date End Date Taking? Authorizing Provider  acetaminophen (TYLENOL) 650 MG CR tablet Take 650 mg by mouth every 8 (eight) hours as needed for pain.     Historical Provider, MD  aspirin 325 MG tablet Take 325 mg by mouth at bedtime.     Historical Provider, MD  hydroxychloroquine (PLAQUENIL) 200 MG tablet Take 200 mg by mouth daily.    Historical Provider, MD  hydrOXYzine (ATARAX/VISTARIL) 25 MG tablet Take 25 mg by mouth every 4 (four) hours as needed for itching.     Historical Provider, MD  ibandronate (BONIVA) 150 MG tablet Take 150 mg by mouth every 30 (thirty) days. Take in the morning with a full glass of water, on an empty stomach, and do not take anything else by mouth or lie down for the next 30 min.    Historical Provider, MD  lansoprazole (PREVACID) 30 MG capsule Take 30 mg by mouth daily at 12 noon.    Historical Provider, MD  levothyroxine (SYNTHROID, LEVOTHROID) 100 MCG tablet Take 100 mcg by mouth daily before breakfast.    Historical Provider, MD  loperamide (IMODIUM) 2 MG capsule Take 1 capsule (2 mg total) by mouth 4 (four) times daily as needed for diarrhea or loose stools. 09/08/14   Linwood Dibbles, MD  MAGNESIUM PO Take 1 tablet by mouth at bedtime.     Historical Provider, MD  Multiple Vitamins-Minerals (MULTIVITAMIN GUMMIES ADULT PO) Take 1 tablet by mouth at bedtime.     Historical Provider, MD  ondansetron (ZOFRAN ODT) 8 MG disintegrating tablet Take  1 tablet (8 mg total) by mouth every 8 (eight) hours as needed for nausea or vomiting. 09/08/14   Linwood Dibbles, MD  OVER THE COUNTER MEDICATION Take 1 tablet by mouth 2 (two) times daily.     Historical Provider, MD  ranitidine (ZANTAC) 300 MG tablet Take 150 mg by mouth daily as needed for heartburn (acid reflux).    Historical Provider, MD  simvastatin (ZOCOR) 40 MG tablet Take 40 mg by mouth daily at 6 PM.     Historical Provider, MD  sucralfate (CARAFATE) 1 G tablet Take 1 tablet (1 g total) by mouth 4 (four) times daily -  with meals and at bedtime. 09/08/14   Linwood Dibbles, MD   BP 121/38 mmHg  Pulse 94  Temp(Src) 99 F (37.2 C) (Oral)  Resp 18  SpO2 100% Physical Exam  Nursing  note and vitals reviewed.  64 year old female, resting comfortably and in no acute distress. Vital signs are normal. Oxygen saturation is 100%, which is normal. Head is normocephalic and atraumatic. PERRLA, EOMI. Oropharynx is clear. Neck is nontender and supple without adenopathy or JVD. Back is nontender and there is no CVA tenderness. Lungs are clear without rales, wheezes, or rhonchi. Chest is nontender. Heart has regular rate and rhythm without murmur. Abdomen is soft, flat, with moderate epigastric tenderness. There is no rebound or guarding. There are no masses or hepatosplenomegaly and peristalsis is hypoactive. Extremities have no cyanosis or edema, full range of motion is present. Skin is warm and dry without rash. Neurologic: Mental status is normal, cranial nerves are intact, there are no motor or sensory deficits.  ED Course  Procedures (including critical care time) Labs Review Results for orders placed or performed during the hospital encounter of 09/09/14  Comprehensive metabolic panel  Result Value Ref Range   Sodium 139 135 - 145 mmol/L   Potassium 3.6 3.5 - 5.1 mmol/L   Chloride 107 101 - 111 mmol/L   CO2 24 22 - 32 mmol/L   Glucose, Bld 112 (H) 65 - 99 mg/dL   BUN 13 6 - 20 mg/dL   Creatinine, Ser 1.61 0.44 - 1.00 mg/dL   Calcium 8.5 (L) 8.9 - 10.3 mg/dL   Total Protein 6.0 (L) 6.5 - 8.1 g/dL   Albumin 3.6 3.5 - 5.0 g/dL   AST 29 15 - 41 U/L   ALT 33 14 - 54 U/L   Alkaline Phosphatase 74 38 - 126 U/L   Total Bilirubin 0.8 0.3 - 1.2 mg/dL   GFR calc non Af Amer >60 >60 mL/min   GFR calc Af Amer >60 >60 mL/min   Anion gap 8 5 - 15  Lipase, blood  Result Value Ref Range   Lipase 27 22 - 51 U/L  CBC with Differential  Result Value Ref Range   WBC 11.0 (H) 4.0 - 10.5 K/uL   RBC 4.11 3.87 - 5.11 MIL/uL   Hemoglobin 11.6 (L) 12.0 - 15.0 g/dL   HCT 09.6 (L) 04.5 - 40.9 %   MCV 84.4 78.0 - 100.0 fL   MCH 28.2 26.0 - 34.0 pg   MCHC 33.4 30.0 - 36.0 g/dL    RDW 81.1 91.4 - 78.2 %   Platelets 148 (L) 150 - 400 K/uL   Neutrophils Relative % 86 (H) 43 - 77 %   Neutro Abs 9.4 (H) 1.7 - 7.7 K/uL   Lymphocytes Relative 4 (L) 12 - 46 %   Lymphs Abs 0.5 (L) 0.7 -  4.0 K/uL   Monocytes Relative 10 3 - 12 %   Monocytes Absolute 1.1 (H) 0.1 - 1.0 K/uL   Eosinophils Relative 0 0 - 5 %   Eosinophils Absolute 0.0 0.0 - 0.7 K/uL   Basophils Relative 0 0 - 1 %   Basophils Absolute 0.0 0.0 - 0.1 K/uL  I-Stat CG4 Lactic Acid, ED  Result Value Ref Range   Lactic Acid, Venous 0.66 0.5 - 2.0 mmol/L   MDM   Final diagnoses:  Epigastric pain  Non-intractable vomiting with nausea, vomiting of unspecified type    Epigastric pain with nausea and vomiting. Old records are reviewed and she was seen in the ED yesterday and had CT of the abdomen and pelvis showing edema of the mucosa of the distal stomach and pylorus and duodenal bulb which had also been seen on the prior CT scan.Marland Kitchen She will be given morphine for pain and ondansetron for nausea. Given findings on CT scan, will also give her pantoprazole.  Workup is unremarkable. WBC has declined from last night. She is feeling much better following IV fluids and metoclopramide and morphine as well as pantoprazole. On reexam, abdomen is benign. She had sucralfate and ondansetron prescribed last night although prescriptions not been filled. She is advised to get those prescriptions filled. She is on lansoprazole at home and she is given a two-week prescription during which time she is to take it twice a day and then resume once a day once the prescription I gave her has run out.   Dione Booze, MD 09/09/14 [phone]

## 2014-09-09 NOTE — Discharge Instructions (Signed)
Increase your lansoprazole to twice a day for the next two weeks. Take the sucralfate as prescribed at your previous ED visit.   Abdominal Pain Many things can cause abdominal pain. Usually, abdominal pain is not caused by a disease and will improve without treatment. It can often be observed and treated at home. Your health care provider will do a physical exam and possibly order blood tests and X-rays to help determine the seriousness of your pain. However, in many cases, more time must pass before a clear cause of the pain can be found. Before that point, your health care provider may not know if you need more testing or further treatment. HOME CARE INSTRUCTIONS  Monitor your abdominal pain for any changes. The following actions may help to alleviate any discomfort you are experiencing:  Only take over-the-counter or prescription medicines as directed by your health care provider.  Do not take laxatives unless directed to do so by your health care provider.  Try a clear liquid diet (broth, tea, or water) as directed by your health care provider. Slowly move to a bland diet as tolerated. SEEK MEDICAL CARE IF:  You have unexplained abdominal pain.  You have abdominal pain associated with nausea or diarrhea.  You have pain when you urinate or have a bowel movement.  You experience abdominal pain that wakes you in the night.  You have abdominal pain that is worsened or improved by eating food.  You have abdominal pain that is worsened with eating fatty foods.  You have a fever. SEEK IMMEDIATE MEDICAL CARE IF:   Your pain does not go away within 2 hours.  You keep throwing up (vomiting).  Your pain is felt only in portions of the abdomen, such as the right side or the left lower portion of the abdomen.  You pass bloody or black tarry stools. MAKE SURE YOU:  Understand these instructions.   Will watch your condition.   Will get help right away if you are not doing well or  get worse.  Document Released: 11/07/2004 Document Revised: 02/02/2013 Document Reviewed: 10/07/2012 [facility] Patient Information 2015 [place], [place]. This information is not intended to replace advice given to you by your health care provider. Make sure you discuss any questions you have with your health care provider.  Nausea and Vomiting Nausea is a sick feeling that often comes before throwing up (vomiting). Vomiting is a reflex where stomach contents come out of your mouth. Vomiting can cause severe loss of body fluids (dehydration). Children and elderly adults can become dehydrated quickly, especially if they also have diarrhea. Nausea and vomiting are symptoms of a condition or disease. It is important to find the cause of your symptoms. CAUSES   Direct irritation of the stomach lining. This irritation can result from increased acid production (gastroesophageal reflux disease), infection, food poisoning, taking certain medicines (such as nonsteroidal anti-inflammatory drugs), alcohol use, or tobacco use.  Signals from the brain.These signals could be caused by a headache, heat exposure, an inner ear disturbance, increased pressure in the brain from injury, infection, a tumor, or a concussion, pain, emotional stimulus, or metabolic problems.  An obstruction in the gastrointestinal tract (bowel obstruction).  Illnesses such as diabetes, hepatitis, gallbladder problems, appendicitis, kidney problems, cancer, sepsis, atypical symptoms of a heart attack, or eating disorders.  Medical treatments such as chemotherapy and radiation.  Receiving medicine that makes you sleep (general anesthetic) during surgery. DIAGNOSIS Your caregiver may ask for tests to be done if the  problems do not improve after a few days. Tests may also be done if symptoms are severe or if the reason for the nausea and vomiting is not clear. Tests may include:  Urine tests.  Blood tests.  Stool tests.  Cultures (to  look for evidence of infection).  X-rays or other imaging studies. Test results can help your caregiver make decisions about treatment or the need for additional tests. TREATMENT You need to stay well hydrated. Drink frequently but in small amounts.You may wish to drink water, sports drinks, clear broth, or eat frozen ice pops or gelatin dessert to help stay hydrated.When you eat, eating slowly may help prevent nausea.There are also some antinausea medicines that may help prevent nausea. HOME CARE INSTRUCTIONS   Take all medicine as directed by your caregiver.  If you do not have an appetite, do not force yourself to eat. However, you must continue to drink fluids.  If you have an appetite, eat a normal diet unless your caregiver tells you differently.  Eat a variety of complex carbohydrates (rice, wheat, potatoes, bread), lean meats, yogurt, fruits, and vegetables.  Avoid high-fat foods because they are more difficult to digest.  Drink enough water and fluids to keep your urine clear or pale yellow.  If you are dehydrated, ask your caregiver for specific rehydration instructions. Signs of dehydration may include:  Severe thirst.  Dry lips and mouth.  Dizziness.  Dark urine.  Decreasing urine frequency and amount.  Confusion.  Rapid breathing or pulse. SEEK IMMEDIATE MEDICAL CARE IF:   You have blood or brown flecks (like coffee grounds) in your vomit.  You have black or bloody stools.  You have a severe headache or stiff neck.  You are confused.  You have severe abdominal pain.  You have chest pain or trouble breathing.  You do not urinate at least once every 8 hours.  You develop cold or clammy skin.  You continue to vomit for longer than 24 to 48 hours.  You have a fever. MAKE SURE YOU:   Understand these instructions.  Will watch your condition.  Will get help right away if you are not doing well or get worse. Document Released: 01/28/2005  Document Revised: 04/22/2011 Document Reviewed: 06/27/2010 [facility] Patient Information 2015 [place], [place]. This information is not intended to replace advice given to you by your health care provider. Make sure you discuss any questions you have with your health care provider.  Acetaminophen; Oxycodone tablets What is this medicine? ACETAMINOPHEN; OXYCODONE (a set a MEE noe fen; ox i KOE done) is a pain reliever. It is used to treat mild to moderate pain. This medicine may be used for other purposes; ask your health care provider or pharmacist if you have questions. COMMON BRAND NAME(S): Endocet, Magnacet, Narvox, Percocet, Perloxx, Primalev, Primlev, Roxicet, Xolox What should I tell my health care provider before I take this medicine? They need to know if you have any of these conditions: -brain tumor -Crohn's disease, inflammatory bowel disease, or ulcerative colitis -drug abuse or addiction -head injury -heart or circulation problems -if you often drink alcohol -kidney disease or problems going to the bathroom -liver disease -lung disease, asthma, or breathing problems -an unusual or allergic reaction to acetaminophen, oxycodone, other opioid analgesics, other medicines, foods, dyes, or preservatives -pregnant or trying to get pregnant -breast-feeding How should I use this medicine? Take this medicine by mouth with a full glass of water. Follow the directions on the prescription label. Take your medicine  at regular intervals. Do not take your medicine more often than directed. Talk to your pediatrician regarding the use of this medicine in children. Special care may be needed. Patients over 58 years old may have a stronger reaction and need a smaller dose. Overdosage: If you think you have taken too much of this medicine contact a poison control center or emergency room at once. NOTE: This medicine is only for you. Do not share this medicine with others. What if I miss a dose? If  you miss a dose, take it as soon as you can. If it is almost time for your next dose, take only that dose. Do not take double or extra doses. What may interact with this medicine? -alcohol -antihistamines -barbiturates like amobarbital, butalbital, butabarbital, methohexital, pentobarbital, phenobarbital, thiopental, and secobarbital -benztropine -drugs for bladder problems like solifenacin, trospium, oxybutynin, tolterodine, hyoscyamine, and methscopolamine -drugs for breathing problems like ipratropium and tiotropium -drugs for certain stomach or intestine problems like propantheline, homatropine methylbromide, glycopyrrolate, atropine, belladonna, and dicyclomine -general anesthetics like etomidate, ketamine, nitrous oxide, propofol, desflurane, enflurane, halothane, isoflurane, and sevoflurane -medicines for depression, anxiety, or psychotic disturbances -medicines for sleep -muscle relaxants -naltrexone -narcotic medicines (opiates) for pain -phenothiazines like perphenazine, thioridazine, chlorpromazine, mesoridazine, fluphenazine, prochlorperazine, promazine, and trifluoperazine -scopolamine -tramadol -trihexyphenidyl This list may not describe all possible interactions. Give your health care provider a list of all the medicines, herbs, non-prescription drugs, or dietary supplements you use. Also tell them if you smoke, drink alcohol, or use illegal drugs. Some items may interact with your medicine. What should I watch for while using this medicine? Tell your doctor or health care professional if your pain does not go away, if it gets worse, or if you have new or a different type of pain. You may develop tolerance to the medicine. Tolerance means that you will need a higher dose of the medication for pain relief. Tolerance is normal and is expected if you take this medicine for a long time. Do not suddenly stop taking your medicine because you may develop a severe reaction. Your body  becomes used to the medicine. This does NOT mean you are addicted. Addiction is a behavior related to getting and using a drug for a non-medical reason. If you have pain, you have a medical reason to take pain medicine. Your doctor will tell you how much medicine to take. If your doctor wants you to stop the medicine, the dose will be slowly lowered over time to avoid any side effects. You may get drowsy or dizzy. Do not drive, use machinery, or do anything that needs mental alertness until you know how this medicine affects you. Do not stand or sit up quickly, especially if you are an older patient. This reduces the risk of dizzy or fainting spells. Alcohol may interfere with the effect of this medicine. Avoid alcoholic drinks. There are different types of narcotic medicines (opiates) for pain. If you take more than one type at the same time, you may have more side effects. Give your health care provider a list of all medicines you use. Your doctor will tell you how much medicine to take. Do not take more medicine than directed. Call emergency for help if you have problems breathing. The medicine will cause constipation. Try to have a bowel movement at least every 2 to 3 days. If you do not have a bowel movement for 3 days, call your doctor or health care professional. Do not take Tylenol (acetaminophen) or medicines  that have acetaminophen with this medicine. Too much acetaminophen can be very dangerous. Many nonprescription medicines contain acetaminophen. Always read the labels carefully to avoid taking more acetaminophen. What side effects may I notice from receiving this medicine? Side effects that you should report to your doctor or health care professional as soon as possible: -allergic reactions like skin rash, itching or hives, swelling of the face, lips, or tongue -breathing difficulties, wheezing -confusion -light headedness or fainting spells -severe stomach pain -unusually weak or  tired -yellowing of the skin or the whites of the eyes Side effects that usually do not require medical attention (report to your doctor or health care professional if they continue or are bothersome): -dizziness -drowsiness -nausea -vomiting This list may not describe all possible side effects. Call your doctor for medical advice about side effects. You may report side effects to FDA at 1-800-FDA-1088. Where should I keep my medicine? Keep out of the reach of children. This medicine can be abused. Keep your medicine in a safe place to protect it from theft. Do not share this medicine with anyone. Selling or giving away this medicine is dangerous and against the law. Store at room temperature between 20 and 25 degrees C (68 and 77 degrees F). Keep container tightly closed. Protect from light. This medicine may cause accidental overdose and death if it is taken by other adults, children, or pets. Flush any unused medicine down the toilet to reduce the chance of harm. Do not use the medicine after the expiration date. NOTE: This sheet is a summary. It may not cover all possible information. If you have questions about this medicine, talk to your doctor, pharmacist, or health care provider.  2015, [employer]. (2012-09-21 13:17:35)

## 2014-09-09 NOTE — ED Notes (Signed)
Pt states her pain is better, but she still feeling nauseous.

## 2014-11-17 ENCOUNTER — Encounter (HOSPITAL_COMMUNITY): Payer: Self-pay | Admitting: *Deleted

## 2014-11-17 ENCOUNTER — Inpatient Hospital Stay (HOSPITAL_COMMUNITY): Payer: Commercial Managed Care - PPO

## 2014-11-17 ENCOUNTER — Emergency Department (HOSPITAL_COMMUNITY): Payer: Commercial Managed Care - PPO

## 2014-11-17 ENCOUNTER — Inpatient Hospital Stay (HOSPITAL_COMMUNITY)
Admission: EM | Admit: 2014-11-17 | Discharge: 2014-11-22 | DRG: 445 | Disposition: A | Discharge status: Home / Self Care | Payer: Commercial Managed Care - PPO | Attending: Internal Medicine | Admitting: Internal Medicine

## 2014-11-17 DIAGNOSIS — Z79899 Other long term (current) drug therapy: Secondary | ICD-10-CM

## 2014-11-17 DIAGNOSIS — K831 Obstruction of bile duct: Secondary | ICD-10-CM | POA: Diagnosis present

## 2014-11-17 DIAGNOSIS — J449 Chronic obstructive pulmonary disease, unspecified: Secondary | ICD-10-CM | POA: Diagnosis present

## 2014-11-17 DIAGNOSIS — K589 Irritable bowel syndrome without diarrhea: Secondary | ICD-10-CM | POA: Diagnosis present

## 2014-11-17 DIAGNOSIS — R1031 Right lower quadrant pain: Secondary | ICD-10-CM

## 2014-11-17 DIAGNOSIS — K219 Gastro-esophageal reflux disease without esophagitis: Secondary | ICD-10-CM | POA: Diagnosis present

## 2014-11-17 DIAGNOSIS — Z7982 Long term (current) use of aspirin: Secondary | ICD-10-CM | POA: Diagnosis not present

## 2014-11-17 DIAGNOSIS — R1013 Epigastric pain: Secondary | ICD-10-CM

## 2014-11-17 DIAGNOSIS — R109 Unspecified abdominal pain: Secondary | ICD-10-CM | POA: Diagnosis present

## 2014-11-17 DIAGNOSIS — R509 Fever, unspecified: Secondary | ICD-10-CM | POA: Diagnosis not present

## 2014-11-17 DIAGNOSIS — Z7983 Long term (current) use of bisphosphonates: Secondary | ICD-10-CM | POA: Diagnosis not present

## 2014-11-17 DIAGNOSIS — F40240 Claustrophobia: Secondary | ICD-10-CM | POA: Diagnosis present

## 2014-11-17 DIAGNOSIS — E876 Hypokalemia: Secondary | ICD-10-CM | POA: Diagnosis present

## 2014-11-17 DIAGNOSIS — K8031 Calculus of bile duct with cholangitis, unspecified, with obstruction: Secondary | ICD-10-CM | POA: Diagnosis present

## 2014-11-17 DIAGNOSIS — Z87891 Personal history of nicotine dependence: Secondary | ICD-10-CM | POA: Diagnosis not present

## 2014-11-17 DIAGNOSIS — R748 Abnormal levels of other serum enzymes: Secondary | ICD-10-CM | POA: Diagnosis present

## 2014-11-17 DIAGNOSIS — Z880 Allergy status to penicillin: Secondary | ICD-10-CM | POA: Diagnosis not present

## 2014-11-17 DIAGNOSIS — E785 Hyperlipidemia, unspecified: Secondary | ICD-10-CM | POA: Diagnosis present

## 2014-11-17 DIAGNOSIS — K580 Irritable bowel syndrome with diarrhea: Secondary | ICD-10-CM | POA: Diagnosis present

## 2014-11-17 DIAGNOSIS — E039 Hypothyroidism, unspecified: Secondary | ICD-10-CM | POA: Diagnosis present

## 2014-11-17 DIAGNOSIS — E162 Hypoglycemia, unspecified: Secondary | ICD-10-CM | POA: Diagnosis not present

## 2014-11-17 DIAGNOSIS — R079 Chest pain, unspecified: Secondary | ICD-10-CM

## 2014-11-17 DIAGNOSIS — K838 Other specified diseases of biliary tract: Secondary | ICD-10-CM

## 2014-11-17 LAB — COMPREHENSIVE METABOLIC PANEL
ALK PHOS: 169 U/L — AB (ref 38–126)
ALT: 166 U/L — ABNORMAL HIGH (ref 14–54)
ANION GAP: 8 (ref 5–15)
AST: 295 U/L — ABNORMAL HIGH (ref 15–41)
Albumin: 3.7 g/dL (ref 3.5–5.0)
BILIRUBIN TOTAL: 0.8 mg/dL (ref 0.3–1.2)
BUN: 23 mg/dL — ABNORMAL HIGH (ref 6–20)
CALCIUM: 8.9 mg/dL (ref 8.9–10.3)
CO2: 23 mmol/L (ref 22–32)
Chloride: 105 mmol/L (ref 101–111)
Creatinine, Ser: 0.78 mg/dL (ref 0.44–1.00)
GFR calc non Af Amer: >60 mL/min (ref >60)
Glucose, Bld: 111 mg/dL — ABNORMAL HIGH (ref 65–99)
Potassium: 4.1 mmol/L (ref 3.5–5.1)
Sodium: 136 mmol/L (ref 135–145)
TOTAL PROTEIN: 6.2 g/dL — AB (ref 6.5–8.1)

## 2014-11-17 LAB — CBC WITH DIFFERENTIAL/PLATELET
BASOS ABS: 0 10*3/uL (ref 0.0–0.1)
BASOS PCT: 0 %
Eosinophils Absolute: 0 10*3/uL (ref 0.0–0.7)
Eosinophils Relative: 1 %
HCT: 37.2 % (ref 36.0–46.0)
HEMOGLOBIN: 12.3 g/dL (ref 12.0–15.0)
Lymphocytes Relative: 15 %
Lymphs Abs: 1 10*3/uL (ref 0.7–4.0)
MCH: 28.3 pg (ref 26.0–34.0)
MCHC: 33.1 g/dL (ref 30.0–36.0)
MCV: 85.5 fL (ref 78.0–100.0)
Monocytes Absolute: 0.5 10*3/uL (ref 0.1–1.0)
Monocytes Relative: 8 %
NEUTROS ABS: 5 10*3/uL (ref 1.7–7.7)
Neutrophils Relative %: 76 %
Platelets: 157 10*3/uL (ref 150–400)
RBC: 4.35 MIL/uL (ref 3.87–5.11)
RDW: 14.2 % (ref 11.5–15.5)
WBC: 6.6 10*3/uL (ref 4.0–10.5)

## 2014-11-17 LAB — LIPASE, BLOOD: Lipase: 42 U/L (ref 22–51)

## 2014-11-17 LAB — TROPONIN I

## 2014-11-17 LAB — GLUCOSE, CAPILLARY: GLUCOSE-CAPILLARY: 117 mg/dL — AB (ref 65–99)

## 2014-11-17 MED ORDER — ONDANSETRON 8 MG PO TBDP: 8.0000 mg | ORAL_TABLET | Freq: Three times a day (TID) | ORAL | Status: DC | PRN

## 2014-11-17 MED ORDER — FAMOTIDINE 20 MG PO TABS
20.0000 mg | ORAL_TABLET | Freq: Once | ORAL | Status: AC
  Administered 2014-11-17: 20 mg via ORAL
  Filled 2014-11-17: qty 1

## 2014-11-17 MED ORDER — ACETAMINOPHEN 325 MG PO TABS
650.0000 mg | ORAL_TABLET | ORAL | Status: AC | PRN
  Administered 2014-11-17 – 2014-11-18 (×2): 650 mg via ORAL
  Filled 2014-11-17 (×2): qty 2

## 2014-11-17 MED ORDER — GI COCKTAIL ~~LOC~~
30.0000 mL | Freq: Once | ORAL | Status: AC
  Administered 2014-11-17: 30 mL via ORAL
  Filled 2014-11-17: qty 30

## 2014-11-17 MED ORDER — LEVOTHYROXINE SODIUM 100 MCG PO TABS
100.0000 ug | ORAL_TABLET | Freq: Every day | ORAL | Status: DC
  Administered 2014-11-18 – 2014-11-22 (×5): 100 ug via ORAL
  Filled 2014-11-17 (×5): qty 1

## 2014-11-17 MED ORDER — SODIUM CHLORIDE 0.9 % IV SOLN
INTRAVENOUS | Status: DC
  Administered 2014-11-17: 11:00:00 via INTRAVENOUS

## 2014-11-17 MED ORDER — HEPARIN SODIUM (PORCINE) 5000 UNIT/ML IJ SOLN
5000.0000 [IU] | Freq: Three times a day (TID) | INTRAMUSCULAR | Status: DC
Start: 2014-11-17 — End: 2014-11-22
  Administered 2014-11-17 – 2014-11-22 (×13): 5000 [IU] via SUBCUTANEOUS
  Filled 2014-11-17 (×13): qty 1

## 2014-11-17 MED ORDER — LORAZEPAM 2 MG/ML IJ SOLN
1.0000 mg | Freq: Once | INTRAMUSCULAR | Status: AC
  Administered 2014-11-17: 1 mg via INTRAVENOUS
  Filled 2014-11-17: qty 1

## 2014-11-17 MED ORDER — METOCLOPRAMIDE HCL 5 MG/ML IJ SOLN
5.0000 mg | Freq: Once | INTRAMUSCULAR | Status: AC
  Administered 2014-11-17: 5 mg via INTRAVENOUS
  Filled 2014-11-17: qty 2

## 2014-11-17 MED ORDER — DEXTROSE-NACL 5-0.9 % IV SOLN
INTRAVENOUS | Status: DC
  Administered 2014-11-18: 11:00:00 via INTRAVENOUS

## 2014-11-17 MED ORDER — LORAZEPAM 2 MG/ML IJ SOLN
0.5000 mg | Freq: Once | INTRAMUSCULAR | Status: AC
  Administered 2014-11-17: 0.5 mg via INTRAVENOUS
  Filled 2014-11-17: qty 1

## 2014-11-17 MED ORDER — SUCRALFATE 1 G PO TABS
1.0000 g | ORAL_TABLET | Freq: Three times a day (TID) | ORAL | Status: DC
  Administered 2014-11-17: 1 g via ORAL
  Filled 2014-11-17: qty 1

## 2014-11-17 MED ORDER — DIPHENHYDRAMINE HCL 50 MG/ML IJ SOLN
12.5000 mg | Freq: Once | INTRAMUSCULAR | Status: AC
  Administered 2014-11-17: 12.5 mg via INTRAVENOUS
  Filled 2014-11-17: qty 1

## 2014-11-17 MED ORDER — SODIUM CHLORIDE 0.9 % IV SOLN
INTRAVENOUS | Status: DC
  Administered 2014-11-17 – 2014-11-18 (×2): via INTRAVENOUS

## 2014-11-17 MED ORDER — HYDROMORPHONE HCL 1 MG/ML IJ SOLN
0.5000 mg | INTRAMUSCULAR | Status: DC | PRN
  Administered 2014-11-19 – 2014-11-20 (×3): 0.5 mg via INTRAVENOUS
  Filled 2014-11-17 (×3): qty 1

## 2014-11-17 MED ORDER — GADOBENATE DIMEGLUMINE 529 MG/ML IV SOLN
15.0000 mL | Freq: Once | INTRAVENOUS | Status: AC | PRN
  Administered 2014-11-17: 14 mL via INTRAVENOUS

## 2014-11-17 MED ORDER — MORPHINE SULFATE (PF) 4 MG/ML IV SOLN
4.0000 mg | Freq: Once | INTRAVENOUS | Status: AC
  Administered 2014-11-17: 4 mg via INTRAVENOUS
  Filled 2014-11-17: qty 1

## 2014-11-17 MED ORDER — HYDROXYCHLOROQUINE SULFATE 200 MG PO TABS
200.0000 mg | ORAL_TABLET | Freq: Every day | ORAL | Status: DC
  Administered 2014-11-17 – 2014-11-18 (×2): 200 mg via ORAL
  Filled 2014-11-17 (×2): qty 1

## 2014-11-17 MED ORDER — SUCRALFATE 1 G PO TABS
1.0000 g | ORAL_TABLET | Freq: Three times a day (TID) | ORAL | Status: DC
Start: 2014-11-17 — End: 2014-11-17

## 2014-11-17 NOTE — ED Notes (Signed)
ATTEMPTED REPORT TO 5W.

## 2014-11-17 NOTE — H&P (Signed)
[facility] History and Physical  Kellina Delsol WUJ:[id number] DOB: November 18, 1950 DOA: 11/17/2014  Referring physician: er PCP: No primary care provider on file.   Chief Complaint: abd pain  HPI: Norma Walton is a 64 y.o. female  With PMHX of cholecystectomy, sphincter of oti dilation.  She is from [place], [place].  She has been in [place] for 7 months working.  She plans to return to [facility] later this month.    She had several days of diarrhea but did not start feeling unwell until this AM when she was unable to eat and developed epigastric pain that radiated to her back.  No fever, no chills.  In the ER, she developed Nausea and vomiting.   No SOB, no CP  She was found to have elevated liver enzymes and U/S of abd showed increase diameter of CBD.  GI was consulted who recommended pain control and outpatient follow up.  Pain has not been controlled and she continued to vomit.  GI then recommended MRCP.  Patient states she is claustrophobic so we will try medication with ativan before procedure     Review of Systems:  All systems reviewed, negative unless stated above    Past Medical History  Diagnosis Date  . IBS (irritable bowel syndrome)   . COPD (chronic obstructive pulmonary disease) (HCC)   . GERD (gastroesophageal reflux disease)   . Lupus (HCC)   . Thyroid disease     hypothyroid  . Hypoglycemia    Past Surgical History  Procedure Laterality Date  . Cholecystectomy    . Abdominal hysterectomy    . Cystectomy    . Breast surgery Left     lump removed   Social History:  reports that she has quit smoking. She does not have any smokeless tobacco history on file. She reports that she does not drink alcohol or use illicit drugs.  Allergies  Allergen Reactions  . Penicillins Anaphylaxis    History reviewed. No pertinent family history.   Prior to Admission medications   Medication Sig Start Date End Date Taking? Authorizing Provider  acetaminophen (TYLENOL) 650 MG CR tablet  Take 650 mg by mouth every 8 (eight) hours as needed for pain.    Historical Provider, MD  aspirin 325 MG tablet Take 325 mg by mouth at bedtime.     Historical Provider, MD  hydroxychloroquine (PLAQUENIL) 200 MG tablet Take 200 mg by mouth daily.    Historical Provider, MD  hydrOXYzine (ATARAX/VISTARIL) 25 MG tablet Take 25 mg by mouth every 4 (four) hours as needed for itching.     Historical Provider, MD  ibandronate (BONIVA) 150 MG tablet Take 150 mg by mouth every 30 (thirty) days. Take in the morning with a full glass of water, on an empty stomach, and do not take anything else by mouth or lie down for the next 30 min.    Historical Provider, MD  lansoprazole (PREVACID) 30 MG capsule Take 1 capsule (30 mg total) by mouth daily at 12 noon. 09/09/14   Dione Booze, MD  levothyroxine (SYNTHROID, LEVOTHROID) 100 MCG tablet Take 100 mcg by mouth daily before breakfast.    Historical Provider, MD  loperamide (IMODIUM) 2 MG capsule Take 1 capsule (2 mg total) by mouth 4 (four) times daily as needed for diarrhea or loose stools. 09/08/14   Linwood Dibbles, MD  MAGNESIUM PO Take 1 tablet by mouth at bedtime.     Historical Provider, MD  Multiple Vitamins-Minerals (MULTIVITAMIN GUMMIES ADULT PO) Take 1  tablet by mouth at bedtime.     Historical Provider, MD  ondansetron (ZOFRAN ODT) 8 MG disintegrating tablet Take 1 tablet (8 mg total) by mouth every 8 (eight) hours as needed for nausea or vomiting. 09/08/14   Linwood Dibbles, MD  OVER THE COUNTER MEDICATION Take 1 tablet by mouth 2 (two) times daily.     Historical Provider, MD  oxyCODONE-acetaminophen (PERCOCET) 5-325 MG per tablet Take 1 tablet by mouth every 4 (four) hours as needed for moderate pain. 09/09/14   Dione Booze, MD  ranitidine (ZANTAC) 300 MG tablet Take 150 mg by mouth daily as needed for heartburn (acid reflux).    Historical Provider, MD  simvastatin (ZOCOR) 40 MG tablet Take 40 mg by mouth daily at 6 PM.     Historical Provider, MD  sucralfate  (CARAFATE) 1 G tablet Take 1 tablet (1 g total) by mouth 4 (four) times daily -  with meals and at bedtime. 09/08/14   Linwood Dibbles, MD   Physical Exam: Filed Vitals:   11/17/14 1430 11/17/14 1445 11/17/14 1500 11/17/14 1515  BP: 94/55 100/50 106/57 104/48  Pulse: 71 78 70 73  Temp:      TempSrc:      Resp: 21 20 15 17   Height:      Weight:      SpO2: 100% 100% 98% 97%    Wt Readings from Last 3 Encounters:  11/17/14 69.854 kg (154 lb)  09/08/14 69.854 kg (154 lb)  06/28/14 70.308 kg (155 lb)    General:  Pale, chronically ill appearing, uncomfortable Eyes: PERRL, normal lids, irises & conjunctiva ENT: grossly normal hearing, lips & tongue Neck: no LAD, masses or thyromegaly Cardiovascular: RRR, no m/r/g. No LE edema. Respiratory: CTA bilaterally, no w/r/r. Normal respiratory effort. Abdomen: soft, not very tender in RUQ, +BS Skin: no rash or induration seen on limited exam Musculoskeletal: grossly normal tone BUE/BLE Psychiatric: grossly normal mood and affect, speech fluent and appropriate Neurologic: grossly non-focal.          Labs on Admission:  Basic Metabolic Panel:  Recent Labs Lab 11/17/14 1130  NA 136  K 4.1  CL 105  CO2 23  GLUCOSE 111*  BUN 23*  CREATININE 0.78  CALCIUM 8.9   Liver Function Tests:  Recent Labs Lab 11/17/14 1130  AST 295*  ALT 166*  ALKPHOS 169*  BILITOT 0.8  PROT 6.2*  ALBUMIN 3.7    Recent Labs Lab 11/17/14 1130  LIPASE 42   No results for input(s): AMMONIA in the last 168 hours. CBC:  Recent Labs Lab 11/17/14 1130  WBC 6.6  NEUTROABS 5.0  HGB 12.3  HCT 37.2  MCV 85.5  PLT 157   Cardiac Enzymes:  Recent Labs Lab 11/17/14 1130 11/17/14 1609  TROPONINI <0.03 <0.03    BNP (last 3 results) No results for input(s): BNP in the last 8760 hours.  ProBNP (last 3 results) No results for input(s): PROBNP in the last 8760 hours.  CBG: No results for input(s): GLUCAP in the last 168 hours.  Radiological  Exams on Admission: Dg Chest 2 View  11/17/2014   CLINICAL DATA:  Intermittent chest pain.  EXAM: CHEST  2 VIEW  COMPARISON:  None.  FINDINGS: The heart size and mediastinal contours are within normal limits. Both lungs are clear. The visualized skeletal structures are unremarkable.  IMPRESSION: No active cardiopulmonary disease.   Electronically Signed   By: Elige Ko   On: 11/17/2014 12:07   US  Abdomen Complete  11/17/2014   CLINICAL DATA:  Abdominal pain, lupus, COPD, irritable bowel syndrome, GERD, former smoker  EXAM: ULTRASOUND ABDOMEN COMPLETE  COMPARISON:  CT abdomen pelvis 09/08/2014  FINDINGS: Gallbladder: Surgically absent  Common bile duct: Diameter: Dilated 16 mm diameter, measured 13 mm on prior CT.  Liver: Upper normal parenchymal echogenicity. Intrahepatic biliary dilatation. No definite hepatic mass lesion or nodularity. Hepatopetal portal venous flow.  IVC: Normal appearance  Pancreas: Normal appearance  Spleen: Normal appearance, 5.0 cm length  Right Kidney: Length: 8.7 cm. Normal cortical thickness. Slightly increased cortical echogenicity. No mass or hydronephrosis.  Left Kidney: Length: 10.2 cm. Normal cortical thickness. Slightly increased cortical echogenicity. No solid mass or hydronephrosis.  Abdominal aorta: Normal caliber.  Other findings: No free fluid  IMPRESSION: Intrahepatic and extrahepatic biliary dilatation with CBD 16 mm diameter, measured 13 mm on prior CT exam; recommend correlation with LFTs.  Probable medical renal disease changes of both kidneys.  No additional sonographic abnormalities identified.   Electronically Signed   By: Ulyses Southward M.D.   On: 11/17/2014 16:19      Assessment/Plan Active Problems:   Abdominal pain   Common bile duct (CBD) obstruction   Elevated liver enzymes  Abd pain -dialudid PRN  Elevated liver enzymes -repeat in AM Does not appear jaundiced- bilirubin not elevated -try MRCP with ativan GI to see in AM CBD-  16mm  IBS  Episodes of hypoglycemia -D5   [facility]  Code Status: full DVT Prophylaxis: Family Communication: patient Disposition Plan:   Time spent: 65 min  Marlin Canary [facility] Pager [phone]

## 2014-11-17 NOTE — ED Provider Notes (Signed)
CSN: [id number]     Arrival date & time 11/17/14  1044 History   First MD Initiated Contact with Patient 11/17/14 1047     Chief Complaint  Patient presents with  . Chest Pain  . Abdominal Pain     (Consider location/radiation/quality/duration/timing/severity/associated sxs/prior Treatment) HPI Comments: Patient with intermittent epigastric pain radiating to her back 1 day. Symptoms last for approximately 15 minutes and were not associated with dyspnea diaphoresis or shortness of breath. Does have a history of IBS and says that this is different. She did take Gas-X without relief. She has had some watery diarrhea recently without blood. Denies any weakness. Is scheduled for stress test next month. No recent anginal symptoms. When the first episode occurred she called EMS and deferred transport but when he came back she was transported here  Patient is a 64 y.o. female presenting with chest pain and abdominal pain. The history is provided by the patient and the spouse.  Chest Pain Associated symptoms: abdominal pain   Abdominal Pain Associated symptoms: chest pain     Past Medical History  Diagnosis Date  . IBS (irritable bowel syndrome)   . COPD (chronic obstructive pulmonary disease) (HCC)   . GERD (gastroesophageal reflux disease)   . Lupus (HCC)   . Thyroid disease     hypothyroid  . Hypoglycemia    Past Surgical History  Procedure Laterality Date  . Cholecystectomy    . Abdominal hysterectomy    . Cystectomy    . Breast surgery Left     lump removed   History reviewed. No pertinent family history. Social History  Substance Use Topics  . Smoking status: Former Games developer  . Smokeless tobacco: None  . Alcohol Use: No   OB History    No data available     Review of Systems  Cardiovascular: Positive for chest pain.  Gastrointestinal: Positive for abdominal pain.  All other systems reviewed and are negative.     Allergies  Penicillins  Home Medications    Prior to Admission medications   Medication Sig Start Date End Date Taking? Authorizing Provider  acetaminophen (TYLENOL) 650 MG CR tablet Take 650 mg by mouth every 8 (eight) hours as needed for pain.    Historical Provider, MD  aspirin 325 MG tablet Take 325 mg by mouth at bedtime.     Historical Provider, MD  hydroxychloroquine (PLAQUENIL) 200 MG tablet Take 200 mg by mouth daily.    Historical Provider, MD  hydrOXYzine (ATARAX/VISTARIL) 25 MG tablet Take 25 mg by mouth every 4 (four) hours as needed for itching.     Historical Provider, MD  ibandronate (BONIVA) 150 MG tablet Take 150 mg by mouth every 30 (thirty) days. Take in the morning with a full glass of water, on an empty stomach, and do not take anything else by mouth or lie down for the next 30 min.    Historical Provider, MD  lansoprazole (PREVACID) 30 MG capsule Take 1 capsule (30 mg total) by mouth daily at 12 noon. 09/09/14   Dione Booze, MD  levothyroxine (SYNTHROID, LEVOTHROID) 100 MCG tablet Take 100 mcg by mouth daily before breakfast.    Historical Provider, MD  loperamide (IMODIUM) 2 MG capsule Take 1 capsule (2 mg total) by mouth 4 (four) times daily as needed for diarrhea or loose stools. 09/08/14   Linwood Dibbles, MD  MAGNESIUM PO Take 1 tablet by mouth at bedtime.     Historical Provider, MD  Multiple Vitamins-Minerals (MULTIVITAMIN GUMMIES  ADULT PO) Take 1 tablet by mouth at bedtime.     Historical Provider, MD  ondansetron (ZOFRAN ODT) 8 MG disintegrating tablet Take 1 tablet (8 mg total) by mouth every 8 (eight) hours as needed for nausea or vomiting. 09/08/14   Linwood Dibbles, MD  OVER THE COUNTER MEDICATION Take 1 tablet by mouth 2 (two) times daily.     Historical Provider, MD  oxyCODONE-acetaminophen (PERCOCET) 5-325 MG per tablet Take 1 tablet by mouth every 4 (four) hours as needed for moderate pain. 09/09/14   Dione Booze, MD  ranitidine (ZANTAC) 300 MG tablet Take 150 mg by mouth daily as needed for heartburn (acid reflux).     Historical Provider, MD  simvastatin (ZOCOR) 40 MG tablet Take 40 mg by mouth daily at 6 PM.     Historical Provider, MD  sucralfate (CARAFATE) 1 G tablet Take 1 tablet (1 g total) by mouth 4 (four) times daily -  with meals and at bedtime. 09/08/14   Linwood Dibbles, MD   BP 132/56 mmHg  Pulse 68  Temp(Src) 97.7 F (36.5 C) (Oral)  Resp 18  Ht  (1.626 m)  Wt 154 lb (69.854 kg)  BMI 26.42 kg/m2  SpO2 100% Physical Exam  Constitutional: She is oriented to person, place, and time. She appears well-developed and well-nourished.  Non-toxic appearance. No distress.  HENT:  Head: Normocephalic and atraumatic.  Eyes: Conjunctivae, EOM and lids are normal. Pupils are equal, round, and reactive to light.  Neck: Normal range of motion. Neck supple. No tracheal deviation present. No thyroid mass present.  Cardiovascular: Normal rate, regular rhythm and normal heart sounds.  Exam reveals no gallop.   No murmur heard. Pulmonary/Chest: Effort normal and breath sounds normal. No stridor. No respiratory distress. She has no decreased breath sounds. She has no wheezes. She has no rhonchi. She has no rales.  Abdominal: Soft. Normal appearance and bowel sounds are normal. She exhibits no distension. There is no tenderness. There is no rebound and no CVA tenderness.  Musculoskeletal: Normal range of motion. She exhibits no edema or tenderness.  Neurological: She is alert and oriented to person, place, and time. She has normal strength. No cranial nerve deficit or sensory deficit. GCS eye subscore is 4. GCS verbal subscore is 5. GCS motor subscore is 6.  Skin: Skin is warm and dry. No abrasion and no rash noted.  Psychiatric: She has a normal mood and affect. Her speech is normal and behavior is normal.  Nursing note and vitals reviewed.   ED Course  Procedures (including critical care time) Labs Review Labs Reviewed  CBC WITH DIFFERENTIAL/PLATELET  COMPREHENSIVE METABOLIC PANEL  LIPASE, BLOOD   TROPONIN I    Imaging Review No results found. I have personally reviewed and evaluated these images and lab results as part of my medical decision-making.   EKG Interpretation   Date/Time:  Thursday November 17 2014 10:56:24 EDT Ventricular Rate:  68 PR Interval:  172 QRS Duration: 91 QT Interval:  420 QTC Calculation: 447 R Axis:   74 Text Interpretation:  Sinus rhythm Low voltage, precordial leads Baseline  wander in lead(s) I II aVR aVL aVF V3 Confirmed by Alaa Mullally  MD, Lytle Malburg  ([id number]) on 11/17/2014 11:13:12 AM      MDM   Final diagnoses:  Chest pain   patient given IV fluids and pain meds here. Initially given GI cocktail without relief. Patient's EKG and labs are not consistent with ACS. Did have elevated liver functions  tests which precipitated her having a abdominal ultrasound. That was positive for an increased diameter of her common bile duct. A consult to Dr. Randa Evens from GI was obtained and recommended is that of patient's pain can be controlled as she can be discharged home. Patient then developed emesis and will require admission for pain control    Lorre Nick, MD 11/17/14 1610

## 2014-11-17 NOTE — ED Notes (Signed)
Patient transported to Ultrasound 

## 2014-11-17 NOTE — ED Notes (Signed)
Pt arrives from home. Pt states she began having centralized CP along with epigastric pain that radiates to back. Pt called EMS this morning and states her pain subsided while they were there and decided not to come to ED. Pt states her pain came back and then she came to the hospital.

## 2014-11-18 ENCOUNTER — Encounter (HOSPITAL_COMMUNITY)
Admission: EM | Disposition: A | Discharge status: Home / Self Care | Payer: Commercial Managed Care - PPO | Source: Home / Self Care | Attending: Internal Medicine

## 2014-11-18 ENCOUNTER — Inpatient Hospital Stay (HOSPITAL_COMMUNITY): Payer: Commercial Managed Care - PPO | Admitting: Anesthesiology

## 2014-11-18 ENCOUNTER — Encounter (HOSPITAL_COMMUNITY): Payer: Self-pay | Admitting: Anesthesiology

## 2014-11-18 ENCOUNTER — Inpatient Hospital Stay (HOSPITAL_COMMUNITY): Payer: Commercial Managed Care - PPO

## 2014-11-18 DIAGNOSIS — R509 Fever, unspecified: Secondary | ICD-10-CM

## 2014-11-18 HISTORY — PX: ERCP: SHX5425

## 2014-11-18 LAB — CBC
HCT: 34.1 % — ABNORMAL LOW (ref 36.0–46.0)
Hemoglobin: 11.7 g/dL — ABNORMAL LOW (ref 12.0–15.0)
MCH: 28.7 pg (ref 26.0–34.0)
MCHC: 34.3 g/dL (ref 30.0–36.0)
MCV: 83.8 fL (ref 78.0–100.0)
Platelets: 138 10*3/uL — ABNORMAL LOW (ref 150–400)
RBC: 4.07 MIL/uL (ref 3.87–5.11)
RDW: 14.5 % (ref 11.5–15.5)
WBC: 11.7 10*3/uL — AB (ref 4.0–10.5)

## 2014-11-18 LAB — COMPREHENSIVE METABOLIC PANEL
ALT: 1251 U/L — AB (ref 14–54)
AST: 1044 U/L — AB (ref 15–41)
Albumin: 3.2 g/dL — ABNORMAL LOW (ref 3.5–5.0)
Alkaline Phosphatase: 218 U/L — ABNORMAL HIGH (ref 38–126)
Anion gap: 12 (ref 5–15)
BILIRUBIN TOTAL: 3.6 mg/dL — AB (ref 0.3–1.2)
BUN: 14 mg/dL (ref 6–20)
CO2: 20 mmol/L — ABNORMAL LOW (ref 22–32)
CREATININE: 0.74 mg/dL (ref 0.44–1.00)
Calcium: 8.5 mg/dL — ABNORMAL LOW (ref 8.9–10.3)
Chloride: 105 mmol/L (ref 101–111)
GFR calc Af Amer: >60 mL/min (ref >60)
Glucose, Bld: 110 mg/dL — ABNORMAL HIGH (ref 65–99)
Potassium: 3.6 mmol/L (ref 3.5–5.1)
Sodium: 137 mmol/L (ref 135–145)
TOTAL PROTEIN: 5.8 g/dL — AB (ref 6.5–8.1)

## 2014-11-18 LAB — LACTIC ACID, PLASMA: LACTIC ACID, VENOUS: 1 mmol/L (ref 0.5–2.0)

## 2014-11-18 LAB — GLUCOSE, CAPILLARY
GLUCOSE-CAPILLARY: 79 mg/dL (ref 65–99)
GLUCOSE-CAPILLARY: 106 mg/dL — AB (ref 65–99)
GLUCOSE-CAPILLARY: 82 mg/dL (ref 65–99)

## 2014-11-18 SURGERY — ERCP, WITH INTERVENTION IF INDICATED
Anesthesia: General

## 2014-11-18 MED ORDER — MAGNESIUM 200 MG PO TABS: ORAL_TABLET | Freq: Every day | ORAL | Status: DC

## 2014-11-18 MED ORDER — CIPROFLOXACIN IN D5W 400 MG/200ML IV SOLN
400.0000 mg | Freq: Two times a day (BID) | INTRAVENOUS | Status: DC
  Administered 2014-11-18 – 2014-11-20 (×5): 400 mg via INTRAVENOUS
  Filled 2014-11-18 (×5): qty 200

## 2014-11-18 MED ORDER — LORAZEPAM 0.5 MG PO TABS
0.5000 mg | ORAL_TABLET | Freq: Once | ORAL | Status: AC
  Administered 2014-11-18: 0.5 mg via ORAL
  Filled 2014-11-18: qty 1

## 2014-11-18 MED ORDER — SODIUM CHLORIDE 0.9 % IV SOLN
INTRAVENOUS | Status: DC | PRN
  Administered 2014-11-18: 08:00:00 via INTRAVENOUS

## 2014-11-18 MED ORDER — SIMVASTATIN 40 MG PO TABS
40.0000 mg | ORAL_TABLET | Freq: Every day | ORAL | Status: DC
  Administered 2014-11-18 – 2014-11-20 (×3): 40 mg via ORAL
  Filled 2014-11-18 (×3): qty 1

## 2014-11-18 MED ORDER — FENTANYL CITRATE (PF) 100 MCG/2ML IJ SOLN
INTRAMUSCULAR | Status: DC | PRN
  Administered 2014-11-18 (×3): 50 ug via INTRAVENOUS

## 2014-11-18 MED ORDER — HYDROXYCHLOROQUINE SULFATE 200 MG PO TABS
200.0000 mg | ORAL_TABLET | Freq: Two times a day (BID) | ORAL | Status: DC
  Administered 2014-11-18 – 2014-11-22 (×8): 200 mg via ORAL
  Filled 2014-11-18 (×8): qty 1

## 2014-11-18 MED ORDER — SODIUM CHLORIDE 0.9 % IV SOLN
INTRAVENOUS | Status: DC | PRN
  Administered 2014-11-18: 30 mL

## 2014-11-18 MED ORDER — MIDAZOLAM HCL 5 MG/5ML IJ SOLN
INTRAMUSCULAR | Status: DC | PRN
Start: 2014-11-18 — End: 2014-11-18
  Administered 2014-11-18: 1 mg via INTRAVENOUS

## 2014-11-18 MED ORDER — METRONIDAZOLE IN NACL 5-0.79 MG/ML-% IV SOLN
500.0000 mg | Freq: Three times a day (TID) | INTRAVENOUS | Status: DC
  Administered 2014-11-18 – 2014-11-20 (×8): 500 mg via INTRAVENOUS
  Filled 2014-11-18 (×8): qty 100

## 2014-11-18 MED ORDER — GLUCAGON HCL RDNA (DIAGNOSTIC) 1 MG IJ SOLR
INTRAMUSCULAR | Status: AC
  Filled 2014-11-18: qty 1

## 2014-11-18 MED ORDER — MENTHOL 3 MG MT LOZG
1.0000 | LOZENGE | OROMUCOSAL | Status: DC | PRN
  Administered 2014-11-18: 3 mg via ORAL
  Filled 2014-11-18: qty 9

## 2014-11-18 MED ORDER — SUCCINYLCHOLINE CHLORIDE 20 MG/ML IJ SOLN
INTRAMUSCULAR | Status: DC | PRN
  Administered 2014-11-18: 80 mg via INTRAVENOUS

## 2014-11-18 MED ORDER — PROPOFOL 10 MG/ML IV BOLUS
INTRAVENOUS | Status: DC | PRN
  Administered 2014-11-18: 150 mg via INTRAVENOUS

## 2014-11-18 MED ORDER — EPHEDRINE SULFATE 50 MG/ML IJ SOLN
INTRAMUSCULAR | Status: DC | PRN
  Administered 2014-11-18: 10 mg via INTRAVENOUS

## 2014-11-18 MED ORDER — IBUPROFEN 400 MG PO TABS
400.0000 mg | ORAL_TABLET | Freq: Once | ORAL | Status: AC
  Administered 2014-11-18: 400 mg via ORAL
  Filled 2014-11-18: qty 1

## 2014-11-18 MED ORDER — LACTATED RINGERS IV SOLN
INTRAVENOUS | Status: DC | PRN
  Administered 2014-11-18: 09:00:00 via INTRAVENOUS

## 2014-11-18 MED ORDER — CIPROFLOXACIN IN D5W 400 MG/200ML IV SOLN: 400.0000 mg | Freq: Once | INTRAVENOUS | Status: DC

## 2014-11-18 MED ORDER — HYDROXYZINE HCL 25 MG PO TABS: 25.0000 mg | ORAL_TABLET | ORAL | Status: DC | PRN

## 2014-11-18 NOTE — Anesthesia Postprocedure Evaluation (Signed)
  Anesthesia Post-op Note  Patient: Norma Walton  Procedure(s) Performed: Procedure(s) (LRB): ENDOSCOPIC RETROGRADE CHOLANGIOPANCREATOGRAPHY (ERCP) (N/A)  Patient Location: PACU  Anesthesia Type: General  Level of Consciousness: awake and alert   Airway and Oxygen Therapy: Patient Spontanous Breathing  Post-op Pain: mild  Post-op Assessment: Post-op Vital signs reviewed, Patient's Cardiovascular Status Stable, Respiratory Function Stable, Patent Airway and No signs of Nausea or vomiting  Last Vitals:  Filed Vitals:   11/18/14 1242  BP: 100/71  Pulse: 88  Temp:   Resp: 16    Post-op Vital Signs: stable   Complications: No apparent anesthesia complications

## 2014-11-18 NOTE — Progress Notes (Signed)
Patient back from MRI, oral temperature 98.8 F after Tylenol given. Denies nausea, denies abdominal pain, resting comfortably.

## 2014-11-18 NOTE — Progress Notes (Addendum)
PROGRESS NOTE  Norma Walton:[id number] DOB: 05-16-1950 DOA: 11/17/2014 PCP: No primary care provider on file.  Assessment/Plan: CBD stone:  S/p ERCP by Dr. Dulce Sellar No ASA/NSAIDs for 5 days post-procedure. Follow clinical and biochemical response to stone extraction. Antibiotics x 10 days. -+fever but now resolved Clear liquid diet today, do not advance today.  Elevated liver enzymes -trend, should improve with stone extraction  IBS  Episodes of hypoglycemia -monitor  Leukocytosis -mild, monitor   Code Status: full Family Communication: patient Disposition Plan:    Consultants:  GI  Procedures:  MRCP  ERCP    HPI/Subjective: Has mild headache this AM   Objective: Filed Vitals:   11/18/14 1131  BP: 103/55  Pulse: 85  Temp:   Resp:     Intake/Output Summary (Last 24 hours) at 11/18/14 1205 Last data filed at 11/18/14 0945  Gross per 24 hour  Intake 1708.33 ml  Output    400 ml  Net 1308.33 ml   Filed Weights   11/17/14 1055 11/17/14 2000  Weight: 69.854 kg (154 lb) 68.7 kg (151 lb 7.3 oz)    Exam:   General:  Awake, NAD  Cardiovascular: rrr  Respiratory: clear  Abdomen: +BS, soft  Musculoskeletal: no edema   Data Reviewed: Basic Metabolic Panel:  Recent Labs Lab 11/17/14 1130 11/18/14 0443  NA 136 137  K 4.1 3.6  CL 105 105  CO2 23 20*  GLUCOSE 111* 110*  BUN 23* 14  CREATININE 0.78 0.74  CALCIUM 8.9 8.5*   Liver Function Tests:  Recent Labs Lab 11/17/14 1130 11/18/14 0443  AST 295* 1044*  ALT 166* 1251*  ALKPHOS 169* 218*  BILITOT 0.8 3.6*  PROT 6.2* 5.8*  ALBUMIN 3.7 3.2*    Recent Labs Lab 11/17/14 1130  LIPASE 42   No results for input(s): AMMONIA in the last 168 hours. CBC:  Recent Labs Lab 11/17/14 1130 11/18/14 0443  WBC 6.6 11.7*  NEUTROABS 5.0  --   HGB 12.3 11.7*  HCT 37.2 34.1*  MCV 85.5 83.8  PLT 157 138*   Cardiac Enzymes:  Recent Labs Lab 11/17/14 1130 11/17/14 1609   TROPONINI <0.03 <0.03   BNP (last 3 results) No results for input(s): BNP in the last 8760 hours.  ProBNP (last 3 results) No results for input(s): PROBNP in the last 8760 hours.  CBG:  Recent Labs Lab 11/17/14 2024  GLUCAP 117*    No results found for this or any previous visit (from the past 240 hour(s)).   Studies: Dg Chest 2 View  11/17/2014   CLINICAL DATA:  Intermittent chest pain.  EXAM: CHEST  2 VIEW  COMPARISON:  None.  FINDINGS: The heart size and mediastinal contours are within normal limits. Both lungs are clear. The visualized skeletal structures are unremarkable.  IMPRESSION: No active cardiopulmonary disease.   Electronically Signed   By: Elige Ko   On: 11/17/2014 12:07   US Abdomen Complete  11/17/2014   CLINICAL DATA:  Abdominal pain, lupus, COPD, irritable bowel syndrome, GERD, former smoker  EXAM: ULTRASOUND ABDOMEN COMPLETE  COMPARISON:  CT abdomen pelvis 09/08/2014  FINDINGS: Gallbladder: Surgically absent  Common bile duct: Diameter: Dilated 16 mm diameter, measured 13 mm on prior CT.  Liver: Upper normal parenchymal echogenicity. Intrahepatic biliary dilatation. No definite hepatic mass lesion or nodularity. Hepatopetal portal venous flow.  IVC: Normal appearance  Pancreas: Normal appearance  Spleen: Normal appearance, 5.0 cm length  Right Kidney: Length: 8.7 cm. Normal cortical thickness. Slightly  increased cortical echogenicity. No mass or hydronephrosis.  Left Kidney: Length: 10.2 cm. Normal cortical thickness. Slightly increased cortical echogenicity. No solid mass or hydronephrosis.  Abdominal aorta: Normal caliber.  Other findings: No free fluid  IMPRESSION: Intrahepatic and extrahepatic biliary dilatation with CBD 16 mm diameter, measured 13 mm on prior CT exam; recommend correlation with LFTs.  Probable medical renal disease changes of both kidneys.  No additional sonographic abnormalities identified.   Electronically Signed   By: Ulyses Southward M.D.   On:  11/17/2014 16:19   Mr 3d Recon At Scanner  11/18/2014   CLINICAL DATA:  Severe epigastric abdominal pain this morning. History of prior cholecystectomy. Recent ultrasound examination showing biliary dilatation.  EXAM: MRI ABDOMEN WITHOUT AND WITH CONTRAST (INCLUDING MRCP)  TECHNIQUE: Multiplanar multisequence MR imaging of the abdomen was performed both before and after the administration of intravenous contrast. Heavily T2-weighted images of the biliary and pancreatic ducts were obtained, and three-dimensional MRCP images were rendered by post processing.  CONTRAST:  14mL MULTIHANCE GADOBENATE DIMEGLUMINE 529 MG/ML IV SOLN  COMPARISON:  Ultrasound 11/17/2014 and CT scan 09/08/2014.  FINDINGS: Lower chest: The lung bases are grossly clear. No obvious infiltrate or effusion. No pericardial effusion.  Hepatobiliary: No focal hepatic lesions or intrahepatic biliary dilatation. Stable intrahepatic biliary dilatation. Stable common bile duct dilatation. The common bile duct measures 12 mm in the head of the pancreas. There is a distal common bile duct stone measuring 8.5 mm.  Pancreas: No mass, inflammation or pancreatic ductal dilatation.  Spleen: Normal size.  No focal lesions.  Adrenals/Urinary Tract: The adrenal glands and kidneys are unremarkable. There is a simple left renal cyst.  Stomach/Bowel: The stomach, duodenum, visualized small bowel and visualized colon are unremarkable. No inflammatory changes, mass lesions or obstructive findings.  Vascular/Lymphatic: The aorta and branch vessels are patent. Scattered atherosclerotic calcifications the major venous structures are patent.  Other: No abdominal wall hernia, ascites or subcutaneous lesions.  Musculoskeletal: No significant bony findings.  IMPRESSION: 1. Intra and extra hepatic biliary dilatation appears relatively stable when compared to prior CT scan and ultrasound. 2. 8.5 mm distal common bile duct stone. 3. Status post cholecystectomy. 4. Normal  pancreas.  Normal caliber pancreatic duct.   Electronically Signed   By: Rudie Meyer M.D.   On: 11/18/2014 08:21   Dg Ercp With Sphincterotomy  11/18/2014   CLINICAL DATA:  64 year old female with a history of sphincterotomy, choledocholithiasis  EXAM: ERCP  TECHNIQUE: Multiple spot images obtained with the fluoroscopic device and submitted for interpretation post-procedure.  FLUOROSCOPY TIME:  Fluoroscopy Time:  4 minutes 21 seconds  COMPARISON:  None.  FINDINGS: Initial images demonstrate endoscope within the upper abdomen and cannulation of the ampulla.  Subsequently there is retrograde infusion of contrast with partial opacification of the extrahepatic biliary system. Surgical changes of cholecystectomy are present.  Balloon basket present within the biliary system.  IMPRESSION: Limited images during ERCP demonstrate balloon basket within a partially opacified extrahepatic biliary system.  Changes of cholecystectomy.  Please refer to the dictated operative report for full details of intraoperative findings and procedure.  Signed,  Yvone Neu. Loreta Ave, DO  [facility]  [facility]   Electronically Signed   By: Gilmer Mor D.O.   On: 11/18/2014 09:52   Mr Abd W/wo Cm/mrcp  11/18/2014   CLINICAL DATA:  Severe epigastric abdominal pain this morning. History of prior cholecystectomy. Recent ultrasound examination showing biliary dilatation.  EXAM: MRI ABDOMEN WITHOUT AND  WITH CONTRAST (INCLUDING MRCP)  TECHNIQUE: Multiplanar multisequence MR imaging of the abdomen was performed both before and after the administration of intravenous contrast. Heavily T2-weighted images of the biliary and pancreatic ducts were obtained, and three-dimensional MRCP images were rendered by post processing.  CONTRAST:  14mL MULTIHANCE GADOBENATE DIMEGLUMINE 529 MG/ML IV SOLN  COMPARISON:  Ultrasound 11/17/2014 and CT scan 09/08/2014.  FINDINGS: Lower chest: The lung bases are grossly  clear. No obvious infiltrate or effusion. No pericardial effusion.  Hepatobiliary: No focal hepatic lesions or intrahepatic biliary dilatation. Stable intrahepatic biliary dilatation. Stable common bile duct dilatation. The common bile duct measures 12 mm in the head of the pancreas. There is a distal common bile duct stone measuring 8.5 mm.  Pancreas: No mass, inflammation or pancreatic ductal dilatation.  Spleen: Normal size.  No focal lesions.  Adrenals/Urinary Tract: The adrenal glands and kidneys are unremarkable. There is a simple left renal cyst.  Stomach/Bowel: The stomach, duodenum, visualized small bowel and visualized colon are unremarkable. No inflammatory changes, mass lesions or obstructive findings.  Vascular/Lymphatic: The aorta and branch vessels are patent. Scattered atherosclerotic calcifications the major venous structures are patent.  Other: No abdominal wall hernia, ascites or subcutaneous lesions.  Musculoskeletal: No significant bony findings.  IMPRESSION: 1. Intra and extra hepatic biliary dilatation appears relatively stable when compared to prior CT scan and ultrasound. 2. 8.5 mm distal common bile duct stone. 3. Status post cholecystectomy. 4. Normal pancreas.  Normal caliber pancreatic duct.   Electronically Signed   By: Rudie Meyer M.D.   On: 11/18/2014 08:21    Scheduled Meds: . ciprofloxacin  400 mg Intravenous Q12H  . heparin  5,000 Units Subcutaneous 3 times per day  . hydroxychloroquine  200 mg Oral Daily  . levothyroxine  100 mcg Oral QAC breakfast  . metronidazole  500 mg Intravenous Q8H   Continuous Infusions: . dextrose 5 % and 0.9% NaCl 75 mL/hr at 11/18/14 1104   Antibiotics Given (last 72 hours)    Date/Time Action Medication Dose Rate   11/17/14 2240 Given   hydroxychloroquine (PLAQUENIL) tablet 200 mg 200 mg    11/18/14 0647 Given   ciprofloxacin (CIPRO) IVPB 400 mg 400 mg 200 mL/hr   11/18/14 1610 Given   metroNIDAZOLE (FLAGYL) IVPB 500 mg 500 mg 100  mL/hr   11/18/14 1103 Given   hydroxychloroquine (PLAQUENIL) tablet 200 mg 200 mg       Active Problems:   Abdominal pain   Common bile duct (CBD) obstruction   Elevated liver enzymes    Time spent: 25 min    Zeda Gangwer  [facility] Pager [phone]. If 7PM-7AM, please contact night-coverage at [email or web address], password [facility] 11/18/2014, 12:05 PM  LOS: 1 day

## 2014-11-18 NOTE — Progress Notes (Signed)
Admission Note  Patient arrived to floor in room 5W15 via bed from ED. Patient alert and oriented X 4. Vitals signs  Oral temperature 101.3 F       Blood pressure 116/62       Pulse 95       RR 20       SpO2 98 % on room air. Denies pain, complain of nausea and active vomiting. Skin intact, no pressure ulcer noted in sacral area.  Patient's ID armband verified with patient/ family, and in place. Information packet given to patient/ family. Fall risk assessed, SR up X2, patient/ family able to verbalize understanding of risks associated with falls and to call nurse or staff to assist before getting out of bed. Patient/ family oriented to room and equipment. Call bell within reach.

## 2014-11-18 NOTE — Care Management Note (Addendum)
Case Management Note  Patient Details  Name: Norma Walton MRN: [id number] Date of Birth: 1951/01/08  Subjective/Objective:                 Date-11-18-14 Initial Assessment   Spoke with patient at the bedside along with husband Zennon Vari [phone].  Introduced self as Sports coach and explained role in discharge planning and how to be reached.  Verified patient lives at [facility] [address]. 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  Verified patient anticipates to go home with spouse, at time of discharge and will have full- time supervision by family  this time to best of their knowledge.  Patient has DME. Expressed potential need for no other DME.  Patient denied  needing help with their medication.  Patient drives to MD appointments.  Verified patient has PCP in [place], and plan to return back later this month. Dr Julien Nordmann in [place], [place].   Plan: CM will continue to follow for discharge planning and [facility] resources.   Lawerance Sabal RN BSN CM [phone]    Action/Plan:   Expected Discharge Date:  11/19/14               Expected Discharge Plan:  Home/Self Care  In-House Referral:     Discharge planning Services  CM Consult  Post Acute Care Choice:    Choice offered to:     DME Arranged:    DME Agency:     HH Arranged:    HH Agency:     Status of Service:  In process, will continue to follow  Medicare Important Message Given:    Date Medicare IM Given:    Medicare IM give by:    Date Additional Medicare IM Given:    Additional Medicare Important Message give by:     If discussed at Long Length of Stay Meetings, dates discussed:    Additional Comments:  Lawerance Sabal, RN 11/18/2014, 2:01 PM

## 2014-11-18 NOTE — Op Note (Signed)
[facility] [address] [place], [address]   ERCP PROCEDURE REPORT        EXAM DATE: 11/18/2014  PATIENT NAME:          Norma Walton, Norma Walton          MR #:        [id number]  BIRTHDATE:       Jun 01, 1950     VISIT #:     [phone] ATTENDING:     Willis Modena, MD     STATUS:     inpatient REFERRING MD:       [facility]  INDICATIONS:  The patient is a 64 yr old female here for an ERCP due to cholangitis, bile duct stone on MRCP. PROCEDURE PERFORMED:     ERCP with sphincterotomy/papillotomy ERCP with removal of calculus/calculi MEDICATIONS:     General endotracheal anesthesia; ciprofloxacin 400 mg IV; metronidazole 500 mg IV  CONSENT: The patient understands the risks and benefits of the procedure and understands that these risks include, but are not limited to: sedation, allergic reaction, infection, perforation and/or bleeding. Alternative means of evaluation and treatment include, among others: physical exam, x-rays, and/or surgical intervention. The patient elects to proceed with this endoscopic procedure.  DESCRIPTION OF PROCEDURE: Informed was verified, confirmed and timeout was successfully executed by the treatment team. With the patient in left semi-prone position, medications were administered intravenously.The Pentax Ercp Scope [id number] was passed from the mouth into the esophagus and further advanced from the esophagus into the stomach. From stomach scope was directed to the second portion of the duodenum.  Major papilla was aligned with the duodenoscope. The scope position was confirmed fluoroscopically. There was evidence of prior biliary sphicterotomy, with distinct pancreatic and biliary tract orifices seen.  Large amount of pus was seen flowing through the biliary orifice pre- and post-instrumentation.  Deep biliary access obtained; there were at least a couple 1-cm sized oblong filling defects noted in the distal common  bile duct with prominent   10mm diameter common bile duct.  Post-cholecystectomy.  Biliary sphincterotomy was mildly extended with blended current without immediate complication.  Basket and balloon devices used to remove several soft large stone fragments, each about 1cm in diameter. Completion occlusion cholangiogram showed no obvious residual filling defects.  There was good bile flow per biliary orifice at conclusion of procedure.  Pancreatogram was not obtained, intentionally.      ADVERSE EVENT:     No immediate adverse events IMPRESSIONS:     Bile duct stones, removed after extension of biliary sphincterotomy.  Pus in bile consistent with cholangitis.  RECOMMENDATIONS:     1.  Watch for potential complications of procedure. 2.  No ASA/NSAIDs for 5 days post-procedure. 3.  Follow clinical and biochemical response to stone extraction. 4.  Antibiotics x 10 days. 5.  Clear liquid diet today, do not advance today. 6.  [facility] will follow. REPEAT EXAM:   ___________________________________ Willis Modena, MD eSigned:  Willis Modena, MD 11/18/2014 9:42 AM   cc:

## 2014-11-18 NOTE — Anesthesia Procedure Notes (Signed)
Procedure Name: Intubation Date/Time: 11/18/2014 8:49 AM Performed by: Ferol Luz L Pre-anesthesia Checklist: Patient identified, Emergency Drugs available, Suction available, Patient being monitored and Timeout performed Patient Re-evaluated:Patient Re-evaluated prior to inductionOxygen Delivery Method: Circle system utilized Preoxygenation: Pre-oxygenation with 100% oxygen Intubation Type: IV induction Ventilation: Mask ventilation without difficulty Laryngoscope Size: Mac and 3 Grade View: Grade II Tube type: Oral Tube size: 7.0 mm Number of attempts: 1 Airway Equipment and Method: Stylet Placement Confirmation: ETT inserted through vocal cords under direct vision,  positive ETCO2 and breath sounds checked- equal and bilateral Secured at: 20 cm Tube secured with: Tape Dental Injury: Teeth and Oropharynx as per pre-operative assessment

## 2014-11-18 NOTE — Anesthesia Preprocedure Evaluation (Addendum)
Anesthesia Evaluation  Patient identified by MRN, date of birth, ID band Patient awake    Reviewed: Allergy & Precautions, H&P , NPO status , Patient's Chart, lab work & pertinent test results  History of Anesthesia Complications Negative for: history of anesthetic complications  Airway Mallampati: III  TM Distance: >3 FB Neck ROM: full    Dental  (+) Edentulous Upper, Missing   Pulmonary neg pulmonary ROS, former smoker,    Pulmonary exam normal breath sounds clear to auscultation       Cardiovascular negative cardio ROS Normal cardiovascular exam Rhythm:regular Rate:Normal     Neuro/Psych negative neurological ROS     GI/Hepatic Neg liver ROS, GERD  ,  Endo/Other  negative endocrine ROS  Renal/GU negative Renal ROS     Musculoskeletal   Abdominal   Peds  Hematology negative hematology ROS (+)   Anesthesia Other Findings   Reproductive/Obstetrics negative OB ROS                            Anesthesia Physical Anesthesia Plan  ASA: III  Anesthesia Plan: General   Post-op Pain Management:    Induction: Intravenous and Rapid sequence  Airway Management Planned: Oral ETT  Additional Equipment:   Intra-op Plan:   Post-operative Plan: Extubation in OR  Informed Consent: I have reviewed the patients History and Physical, chart, labs and discussed the procedure including the risks, benefits and alternatives for the proposed anesthesia with the patient or authorized representative who has indicated his/her understanding and acceptance.   Dental Advisory Given  Plan Discussed with: Anesthesiologist, CRNA and Surgeon  Anesthesia Plan Comments: (Patient with nausea, pain and vomiting here for ERCP due to common bile duct obstruction  RSI with oral ETT)        Anesthesia Quick Evaluation

## 2014-11-18 NOTE — Interval H&P Note (Signed)
History and Physical Interval Note:  11/18/2014 8:34 AM  Norma Walton  has presented today for surgery, with the diagnosis of abdominal pain, dilated bile duct, elevated LFTs  The various methods of treatment have been discussed with the patient and family. After consideration of risks, benefits and other options for treatment, the patient has consented to  Procedure(s): ENDOSCOPIC RETROGRADE CHOLANGIOPANCREATOGRAPHY (ERCP) (N/A) as a surgical intervention .  The patient's history has been reviewed, patient examined, no change in status, stable for surgery.  I have reviewed the patient's chart and labs.  Questions were answered to the patient's satisfaction.     Amiri Riechers M  Assessment:  1.  Bile duct stone.  Plan:  1.  Endoscopic retrograde cholangiopancreatography with hopeful bile duct stone extraction. 2.  Risks (up to and including bleeding, infection, perforation, pancreatitis that can be complicated by infected necrosis and death), benefits (removal of stones, alleviating blockage, decreasing risk of cholangitis or choledocholithiasis-related pancreatitis), and alternatives (watchful waiting, percutaneous transhepatic cholangiography) of ERCP were explained to patient/family in detail and patient elects to proceed.

## 2014-11-18 NOTE — Consult Note (Signed)
[facility] Consultation Note  Referring Provider: Dr. Marlin Canary [facility]) Primary Care Physician:  No primary care provider on file.  Reason for Consultation:  Abdominal pain, elevated LFTs  HPI: Norma Walton is a 64 y.o. female whom we've been asked to see for the above problems.  She has history of cholecystectomy age 20 (44 years ago).  History Sphincter of Oddi dysfunction for which she had ERCP with biliary sphincterotomy, about 5 years ago, procedure complicated by mild pancreatitis, all done elsewhere.  Patient was doing ok for the past 5 years until about 3 months ago.  At that time, she began having intermittent sharp and stabbing epigastric and low chest discomfort, seems worse with spicy/greasy foods and better with bland diet.  Associated symptoms include nausea and vomiting.  Imaging studies (ultrasound and CT scan) over the past several weeks have shown dilated bile duct but no obvious stone, and liver tests (until early this morning) have shown mild elevation of ALP and transaminases.  Overnight, she developed jaundice (with hyperbilirubinemia and overall worsening of her liver tests) and had temperature 101, and was started on metronidazole and ciprofloxacin.  She does not have any abdominal pain right now.  MRCP done overnight, which I have personally reviewed and which I have personally discussed with Dr. Pecolia Ades, showed dilated bile duct and an 8-mm distal common bile duct stone.  No blood in stool, loss-of-appetite, unintentional weight loss.   Past Medical History  Diagnosis Date  . IBS (irritable bowel syndrome)   . COPD (chronic obstructive pulmonary disease) (HCC)   . GERD (gastroesophageal reflux disease)   . Lupus (HCC)   . Thyroid disease     hypothyroid  . Hypoglycemia     Past Surgical History  Procedure Laterality Date  . Cholecystectomy    . Abdominal hysterectomy    . Cystectomy    . Breast surgery Left     lump removed    Prior to Admission  medications   Medication Sig Start Date End Date Taking? Authorizing Provider  acetaminophen (TYLENOL) 650 MG CR tablet Take 650 mg by mouth every 8 (eight) hours as needed for pain.   Yes Historical Provider, MD  aspirin EC 81 MG tablet Take 81 mg by mouth at bedtime.   Yes Historical Provider, MD  hydroxychloroquine (PLAQUENIL) 200 MG tablet Take 200 mg by mouth 2 (two) times daily.    Yes Historical Provider, MD  hydrOXYzine (ATARAX/VISTARIL) 25 MG tablet Take 25 mg by mouth every 4 (four) hours as needed for itching.    Yes Historical Provider, MD  ibandronate (BONIVA) 150 MG tablet Take 150 mg by mouth every 30 (thirty) days. Take in the morning with a full glass of water, on an empty stomach, and do not take anything else by mouth or lie down for the next 30 min.   Yes Historical Provider, MD  lansoprazole (PREVACID) 30 MG capsule Take 1 capsule (30 mg total) by mouth daily at 12 noon. 09/09/14  Yes Dione Booze, MD  levothyroxine (SYNTHROID, LEVOTHROID) 100 MCG tablet Take 100 mcg by mouth daily before breakfast.   Yes Historical Provider, MD  MAGNESIUM PO Take 1 tablet by mouth at bedtime.    Yes Historical Provider, MD  Multiple Vitamins-Minerals (MULTIVITAMIN GUMMIES ADULT PO) Take 2 tablets by mouth at bedtime.    Yes Historical Provider, MD  Omega-3 Fatty Acids (FISH OIL) 645 MG CAPS Take 645 mg by mouth at bedtime.   Yes Historical Provider, MD  OVER  THE COUNTER MEDICATION Take 1 tablet by mouth 2 (two) times daily. Viviscal Hair Growth Supplement   Yes Historical Provider, MD  ranitidine (ZANTAC) 300 MG tablet Take 150 mg by mouth daily as needed for heartburn (acid reflux).   Yes Historical Provider, MD  simvastatin (ZOCOR) 40 MG tablet Take 40 mg by mouth daily at 6 PM.    Yes Historical Provider, MD  loperamide (IMODIUM) 2 MG capsule Take 1 capsule (2 mg total) by mouth 4 (four) times daily as needed for diarrhea or loose stools. Patient not taking: Reported on 11/17/2014 09/08/14   Linwood Dibbles, MD  ondansetron (ZOFRAN ODT) 8 MG disintegrating tablet Take 1 tablet (8 mg total) by mouth every 8 (eight) hours as needed for nausea or vomiting. Patient not taking: Reported on 11/17/2014 09/08/14   Linwood Dibbles, MD  oxyCODONE-acetaminophen (PERCOCET) 5-325 MG per tablet Take 1 tablet by mouth every 4 (four) hours as needed for moderate pain. Patient not taking: Reported on 11/17/2014 09/09/14   Dione Booze, MD  sucralfate (CARAFATE) 1 G tablet Take 1 tablet (1 g total) by mouth 4 (four) times daily -  with meals and at bedtime. Patient not taking: Reported on 11/17/2014 09/08/14   Linwood Dibbles, MD    Current Facility-Administered Medications  Medication Dose Route Frequency Provider Last Rate Last Dose  . acetaminophen (TYLENOL) tablet 650 mg  650 mg Oral Q4H PRN Leanne Chang, NP   650 mg at 11/17/14 2018  . ciprofloxacin (CIPRO) IVPB 400 mg  400 mg Intravenous Q12H Willis Modena, MD   400 mg at 11/18/14 0647  . dextrose 5 %-0.9 % sodium chloride infusion   Intravenous Continuous Joseph Art, DO      . heparin injection 5,000 Units  5,000 Units Subcutaneous 3 times per day Joseph Art, DO   5,000 Units at 11/17/14 2240  . HYDROmorphone (DILAUDID) injection 0.5 mg  0.5 mg Intravenous Q4H PRN Joseph Art, DO      . hydroxychloroquine (PLAQUENIL) tablet 200 mg  200 mg Oral Daily Joseph Art, DO   200 mg at 11/17/14 2240  . levothyroxine (SYNTHROID, LEVOTHROID) tablet 100 mcg  100 mcg Oral QAC breakfast Joseph Art, DO      . metroNIDAZOLE (FLAGYL) IVPB 500 mg  500 mg Intravenous Q8H Willis Modena, MD   500 mg at 11/18/14 1610    Allergies as of 11/17/2014 - Review Complete 11/17/2014  Allergen Reaction Noted  . Penicillins Anaphylaxis 06/28/2014  . Other  11/17/2014    History reviewed. No pertinent family history.  Social History   Social History  . Marital Status: Married    Spouse Name: N/A  . Number of Children: N/A  . Years of Education: N/A   Occupational  History  . Not on file.   Social History Main Topics  . Smoking status: Former Games developer  . Smokeless tobacco: Not on file  . Alcohol Use: No  . Drug Use: No  . Sexual Activity: Not on file   Other Topics Concern  . Not on file   Social History Narrative    Review of Systems: Positive = bold Gen: Denies any fever, chills, rigors, night sweats, anorexia, fatigue, weakness, malaise, involuntary weight loss, and sleep disorder CV: Denies chest pain, angina, palpitations, syncope, orthopnea, PND, peripheral edema, and claudication. Resp: Denies dyspnea, cough, sputum, wheezing, coughing up blood. GI: Described in detail in HPI.    GU : Denies urinary burning, blood in  urine, urinary frequency, urinary hesitancy, nocturnal urination, and urinary incontinence. MS: Denies joint pain or swelling.  Denies muscle weakness, cramps, atrophy.  Derm: Denies rash, itching, oral ulcerations, hives, unhealing ulcers.  Psych: Denies depression, anxiety, memory loss, suicidal ideation, hallucinations,  and confusion. Heme: Denies bruising, bleeding, and enlarged lymph nodes. Neuro:  Denies any headaches, dizziness, paresthesias. Endo:  Denies any problems with DM, thyroid, adrenal function.  Physical Exam: Vital signs in last 24 hours: Temp:  [97.7 F (36.5 C)-101.3 F (38.5 C)] 98.9 F (37.2 C) (10/07 0749) Pulse Rate:  [59-95] 84 (10/07 0749) Resp:  [11-22] 12 (10/07 0749) BP: (94-132)/(40-63) 100/40 mmHg (10/07 0749) SpO2:  [95 %-100 %] 96 % (10/07 0749) Weight:  [68.7 kg (151 lb 7.3 oz)-69.854 kg (154 lb)] 68.7 kg (151 lb 7.3 oz) (10/06 2000) Last BM Date: 11/17/14 General:   Alert,  Well-developed, well-nourished, pleasant and cooperative in NAD Head:  Normocephalic and atraumatic. Eyes:  Bilateral scleral icterus.   Conjunctiva pink. Ears:  Normal auditory acuity. Nose:  No deformity, discharge,  or lesions. Mouth:  No deformity or lesions.  Oropharynx pink & moist. Neck:  Supple; no  masses or thyromegaly. Lungs:  Clear throughout to auscultation.   No wheezes, crackles, or rhonchi. No acute distress. Heart:  Regular rate and rhythm; no murmurs, clicks, rubs,  or gallops. Abdomen:  Soft, nontender and nondistended. No masses, hepatosplenomegaly or hernias noted. Normal bowel sounds, without guarding, and without rebound.     Msk:  Symmetrical without gross deformities. Normal posture. Pulses:  Normal pulses noted. Extremities:  Without clubbing or edema. Neurologic:  Alert and  oriented x4;  grossly normal neurologically. Skin:  Diffusely somewhat pale except for some rosacea-type appearance to cheeks. Intact without significant lesions or rashes. Psych:  Alert and cooperative. Normal mood and affect.   Lab Results:  Recent Labs  11/17/14 1130 11/18/14 0443  WBC 6.6 11.7*  HGB 12.3 11.7*  HCT 37.2 34.1*  PLT 157 138*   BMET  Recent Labs  11/17/14 1130 11/18/14 0443  NA 136 137  K 4.1 3.6  CL 105 105  CO2 23 20*  GLUCOSE 111* 110*  BUN 23* 14  CREATININE 0.78 0.74  CALCIUM 8.9 8.5*   LFT  Recent Labs  11/18/14 0443  PROT 5.8*  ALBUMIN 3.2*  AST 1044*  ALT 1251*  ALKPHOS 218*  BILITOT 3.6*   PT/INR No results for input(s): LABPROT, INR in the last 72 hours.  Studies/Results: Dg Chest 2 View  11/17/2014   CLINICAL DATA:  Intermittent chest pain.  EXAM: CHEST  2 VIEW  COMPARISON:  None.  FINDINGS: The heart size and mediastinal contours are within normal limits. Both lungs are clear. The visualized skeletal structures are unremarkable.  IMPRESSION: No active cardiopulmonary disease.   Electronically Signed   By: Elige Ko   On: 11/17/2014 12:07   US Abdomen Complete  11/17/2014   CLINICAL DATA:  Abdominal pain, lupus, COPD, irritable bowel syndrome, GERD, former smoker  EXAM: ULTRASOUND ABDOMEN COMPLETE  COMPARISON:  CT abdomen pelvis 09/08/2014  FINDINGS: Gallbladder: Surgically absent  Common bile duct: Diameter: Dilated 16 mm diameter,  measured 13 mm on prior CT.  Liver: Upper normal parenchymal echogenicity. Intrahepatic biliary dilatation. No definite hepatic mass lesion or nodularity. Hepatopetal portal venous flow.  IVC: Normal appearance  Pancreas: Normal appearance  Spleen: Normal appearance, 5.0 cm length  Right Kidney: Length: 8.7 cm. Normal cortical thickness. Slightly increased cortical echogenicity. No mass or  hydronephrosis.  Left Kidney: Length: 10.2 cm. Normal cortical thickness. Slightly increased cortical echogenicity. No solid mass or hydronephrosis.  Abdominal aorta: Normal caliber.  Other findings: No free fluid  IMPRESSION: Intrahepatic and extrahepatic biliary dilatation with CBD 16 mm diameter, measured 13 mm on prior CT exam; recommend correlation with LFTs.  Probable medical renal disease changes of both kidneys.  No additional sonographic abnormalities identified.   Electronically Signed   By: Ulyses Southward M.D.   On: 11/17/2014 16:19   Mr 3d Recon At Scanner  11/18/2014   CLINICAL DATA:  Severe epigastric abdominal pain this morning. History of prior cholecystectomy. Recent ultrasound examination showing biliary dilatation.  EXAM: MRI ABDOMEN WITHOUT AND WITH CONTRAST (INCLUDING MRCP)  TECHNIQUE: Multiplanar multisequence MR imaging of the abdomen was performed both before and after the administration of intravenous contrast. Heavily T2-weighted images of the biliary and pancreatic ducts were obtained, and three-dimensional MRCP images were rendered by post processing.  CONTRAST:  14mL MULTIHANCE GADOBENATE DIMEGLUMINE 529 MG/ML IV SOLN  COMPARISON:  Ultrasound 11/17/2014 and CT scan 09/08/2014.  FINDINGS: Lower chest: The lung bases are grossly clear. No obvious infiltrate or effusion. No pericardial effusion.  Hepatobiliary: No focal hepatic lesions or intrahepatic biliary dilatation. Stable intrahepatic biliary dilatation. Stable common bile duct dilatation. The common bile duct measures 12 mm in the head of the  pancreas. There is a distal common bile duct stone measuring 8.5 mm.  Pancreas: No mass, inflammation or pancreatic ductal dilatation.  Spleen: Normal size.  No focal lesions.  Adrenals/Urinary Tract: The adrenal glands and kidneys are unremarkable. There is a simple left renal cyst.  Stomach/Bowel: The stomach, duodenum, visualized small bowel and visualized colon are unremarkable. No inflammatory changes, mass lesions or obstructive findings.  Vascular/Lymphatic: The aorta and branch vessels are patent. Scattered atherosclerotic calcifications the major venous structures are patent.  Other: No abdominal wall hernia, ascites or subcutaneous lesions.  Musculoskeletal: No significant bony findings.  IMPRESSION: 1. Intra and extra hepatic biliary dilatation appears relatively stable when compared to prior CT scan and ultrasound. 2. 8.5 mm distal common bile duct stone. 3. Status post cholecystectomy. 4. Normal pancreas.  Normal caliber pancreatic duct.   Electronically Signed   By: Rudie Meyer M.D.   On: 11/18/2014 08:21   Mr Abd W/wo Cm/mrcp  11/18/2014   CLINICAL DATA:  Severe epigastric abdominal pain this morning. History of prior cholecystectomy. Recent ultrasound examination showing biliary dilatation.  EXAM: MRI ABDOMEN WITHOUT AND WITH CONTRAST (INCLUDING MRCP)  TECHNIQUE: Multiplanar multisequence MR imaging of the abdomen was performed both before and after the administration of intravenous contrast. Heavily T2-weighted images of the biliary and pancreatic ducts were obtained, and three-dimensional MRCP images were rendered by post processing.  CONTRAST:  14mL MULTIHANCE GADOBENATE DIMEGLUMINE 529 MG/ML IV SOLN  COMPARISON:  Ultrasound 11/17/2014 and CT scan 09/08/2014.  FINDINGS: Lower chest: The lung bases are grossly clear. No obvious infiltrate or effusion. No pericardial effusion.  Hepatobiliary: No focal hepatic lesions or intrahepatic biliary dilatation. Stable intrahepatic biliary dilatation.  Stable common bile duct dilatation. The common bile duct measures 12 mm in the head of the pancreas. There is a distal common bile duct stone measuring 8.5 mm.  Pancreas: No mass, inflammation or pancreatic ductal dilatation.  Spleen: Normal size.  No focal lesions.  Adrenals/Urinary Tract: The adrenal glands and kidneys are unremarkable. There is a simple left renal cyst.  Stomach/Bowel: The stomach, duodenum, visualized small bowel and visualized colon are unremarkable. No  inflammatory changes, mass lesions or obstructive findings.  Vascular/Lymphatic: The aorta and branch vessels are patent. Scattered atherosclerotic calcifications the major venous structures are patent.  Other: No abdominal wall hernia, ascites or subcutaneous lesions.  Musculoskeletal: No significant bony findings.  IMPRESSION: 1. Intra and extra hepatic biliary dilatation appears relatively stable when compared to prior CT scan and ultrasound. 2. 8.5 mm distal common bile duct stone. 3. Status post cholecystectomy. 4. Normal pancreas.  Normal caliber pancreatic duct.   Electronically Signed   By: Rudie Meyer M.D.   On: 11/18/2014 08:21   Impression:  1.  Abdominal pain, epigastric, intermittent, quiescent at present. 2.  Elevated LFTs. 3.  Dilated bile duct.   4.  New onset jaundice and fevers.  Concern for early cholangitis.  On broad-spectrum enteric-coverage antibiotics. 5.  Choledocholithiasis, as noted on MRCP (which I have personally reviewed myself and with Dr. Pecolia Ades). 6.  Prior ERCP for Sphincter of Oddi dysfunction, with biliary sphincterotomy done per patient report about 5 years ago.  Plan:  1.  Continue antibiotics (ciprofloxacin and metronidazole, given patient's penicillin allergy). 2.  Endoscopic retrograde cholangiopancreatography for attempted bile duct stone extraction +/- stent placement. 3.  Risks (up to and including bleeding, infection, perforation, pancreatitis that can be complicated by infected  necrosis and death), benefits (removal of stones, alleviating blockage, decreasing risk of cholangitis or choledocholithiasis-related pancreatitis), and alternatives (watchful waiting, percutaneous transhepatic cholangiography) of ERCP were explained to patient/family in detail and patient elects to proceed.   LOS: 1 day   Earnstine Meinders M  11/18/2014, 8:35 AM  Pager [phone] If no answer or after 5 PM call [phone]

## 2014-11-18 NOTE — Transfer of Care (Signed)
Immediate Anesthesia Transfer of Care Note  Patient: Norma Walton  Procedure(s) Performed: Procedure(s): ENDOSCOPIC RETROGRADE CHOLANGIOPANCREATOGRAPHY (ERCP) (N/A)  Patient Location: PACU and Endoscopy Unit  Anesthesia Type:General  Level of Consciousness: awake, alert , oriented and patient cooperative  Airway & Oxygen Therapy: Patient Spontanous Breathing and Patient connected to nasal cannula oxygen  Post-op Assessment: Report given to RN, Post -op Vital signs reviewed and stable and Patient moving all extremities  Post vital signs: Reviewed and stable  Last Vitals:  Filed Vitals:   11/18/14 0950  BP:   Pulse: 98  Temp:   Resp: 19    Complications: No apparent anesthesia complications

## 2014-11-19 LAB — CBC
HCT: 31 % — ABNORMAL LOW (ref 36.0–46.0)
Hemoglobin: 10.3 g/dL — ABNORMAL LOW (ref 12.0–15.0)
MCH: 28.8 pg (ref 26.0–34.0)
MCHC: 33.2 g/dL (ref 30.0–36.0)
MCV: 86.6 fL (ref 78.0–100.0)
Platelets: 106 10*3/uL — ABNORMAL LOW (ref 150–400)
RBC: 3.58 MIL/uL — ABNORMAL LOW (ref 3.87–5.11)
RDW: 14.9 % (ref 11.5–15.5)
WBC: 3.5 10*3/uL — AB (ref 4.0–10.5)

## 2014-11-19 LAB — GLUCOSE, CAPILLARY
Glucose-Capillary: 98 mg/dL (ref 65–99)
Glucose-Capillary: 89 mg/dL (ref 65–99)
Glucose-Capillary: 79 mg/dL (ref 65–99)
Glucose-Capillary: 91 mg/dL (ref 65–99)

## 2014-11-19 LAB — COMPREHENSIVE METABOLIC PANEL
ALBUMIN: 3 g/dL — AB (ref 3.5–5.0)
ALK PHOS: 193 U/L — AB (ref 38–126)
ALT: 710 U/L — ABNORMAL HIGH (ref 14–54)
ANION GAP: 6 (ref 5–15)
AST: 324 U/L — AB (ref 15–41)
BILIRUBIN TOTAL: 3.6 mg/dL — AB (ref 0.3–1.2)
BUN: 6 mg/dL (ref 6–20)
CALCIUM: 8.4 mg/dL — AB (ref 8.9–10.3)
CO2: 24 mmol/L (ref 22–32)
Chloride: 112 mmol/L — ABNORMAL HIGH (ref 101–111)
Creatinine, Ser: 0.69 mg/dL (ref 0.44–1.00)
GFR calc Af Amer: >60 mL/min (ref >60)
GLUCOSE: 94 mg/dL (ref 65–99)
Potassium: 3.7 mmol/L (ref 3.5–5.1)
Sodium: 142 mmol/L (ref 135–145)
TOTAL PROTEIN: 5.5 g/dL — AB (ref 6.5–8.1)

## 2014-11-19 MED ORDER — FENTANYL CITRATE (PF) 100 MCG/2ML IJ SOLN: 25.0000 ug | INTRAMUSCULAR | Status: DC | PRN

## 2014-11-19 MED ORDER — PANTOPRAZOLE SODIUM 40 MG PO TBEC
40.0000 mg | DELAYED_RELEASE_TABLET | Freq: Every day | ORAL | Status: DC
  Administered 2014-11-19 – 2014-11-22 (×4): 40 mg via ORAL
  Filled 2014-11-19 (×4): qty 1

## 2014-11-19 MED ORDER — ALUM & MAG HYDROXIDE-SIMETH 200-200-20 MG/5ML PO SUSP: 30.0000 mL | ORAL | Status: DC | PRN

## 2014-11-19 MED ORDER — PANTOPRAZOLE SODIUM 40 MG PO TBEC: 40.0000 mg | DELAYED_RELEASE_TABLET | Freq: Every day | ORAL | Status: DC

## 2014-11-19 MED ORDER — ONDANSETRON HCL 4 MG/2ML IJ SOLN
4.0000 mg | Freq: Once | INTRAMUSCULAR | Status: AC | PRN
  Administered 2014-11-19: 4 mg via INTRAVENOUS
  Filled 2014-11-19: qty 2

## 2014-11-19 NOTE — Progress Notes (Signed)
[facility] Progress Note  Subjective: The patient is doing well today after her ERCP yesterday. She has no complaints of abdominal pain. She remains on antibiotics.  Objective: Vital signs in last 24 hours: Temp:  [97.9 F (36.6 C)-99.5 F (37.5 C)] 97.9 F (36.6 C) (10/08 0701) Pulse Rate:  [69-96] 69 (10/08 0825) Resp:  [16-18] 18 (10/08 0701) BP: (91-122)/(44-75) 111/44 mmHg (10/08 0701) SpO2:  [76 %-100 %] 100 % (10/08 0825) Weight change:    PE:  No distress  Heart regular rhythm  Lungs clear  Abdomen soft and nontender  Lab Results: Results for orders placed or performed during the hospital encounter of 11/17/14 (from the past 24 hour(s))  Glucose, capillary     Status: None   Collection Time: 11/18/14 12:40 PM  Result Value Ref Range   Glucose-Capillary 79 65 - 99 mg/dL  Glucose, capillary     Status: Abnormal   Collection Time: 11/18/14  5:26 PM  Result Value Ref Range   Glucose-Capillary 106 (H) 65 - 99 mg/dL  Glucose, capillary     Status: None   Collection Time: 11/18/14  9:21 PM  Result Value Ref Range   Glucose-Capillary 82 65 - 99 mg/dL  Comprehensive metabolic panel     Status: Abnormal   Collection Time: 11/19/14  5:24 AM  Result Value Ref Range   Sodium 142 135 - 145 mmol/L   Potassium 3.7 3.5 - 5.1 mmol/L   Chloride 112 (H) 101 - 111 mmol/L   CO2 24 22 - 32 mmol/L   Glucose, Bld 94 65 - 99 mg/dL   BUN 6 6 - 20 mg/dL   Creatinine, Ser 1.61 0.44 - 1.00 mg/dL   Calcium 8.4 (L) 8.9 - 10.3 mg/dL   Total Protein 5.5 (L) 6.5 - 8.1 g/dL   Albumin 3.0 (L) 3.5 - 5.0 g/dL   AST 096 (H) 15 - 41 U/L   ALT 710 (H) 14 - 54 U/L   Alkaline Phosphatase 193 (H) 38 - 126 U/L   Total Bilirubin 3.6 (H) 0.3 - 1.2 mg/dL   GFR calc non Af Amer >60 >60 mL/min   GFR calc Af Amer >60 >60 mL/min   Anion gap 6 5 - 15  CBC     Status: Abnormal   Collection Time: 11/19/14  5:24 AM  Result Value Ref Range   WBC 3.5 (L) 4.0 - 10.5 K/uL   RBC 3.58 (L) 3.87  - 5.11 MIL/uL   Hemoglobin 10.3 (L) 12.0 - 15.0 g/dL   HCT 04.5 (L) 40.9 - 81.1 %   MCV 86.6 78.0 - 100.0 fL   MCH 28.8 26.0 - 34.0 pg   MCHC 33.2 30.0 - 36.0 g/dL   RDW 91.4 78.2 - 95.6 %   Platelets 106 (L) 150 - 400 K/uL  Glucose, capillary     Status: None   Collection Time: 11/19/14  8:23 AM  Result Value Ref Range   Glucose-Capillary 98 65 - 99 mg/dL    Studies/Results: No results found.    Assessment: Choledocholithiasis. Status post ERCP with sphincterotomy  Plan:   Continue antibiotics. I think these can be converted to oral in the near future and she will need to remain on them for 10 days total. Call us if needed for any further intervention. When she goes home she can follow-up with Dr. Dulce Sellar in 2 or 3 weeks.    SAM F Davaris Youtsey 11/19/2014, 11:55 AM  Pager: [phone] If no answer or after  5 PM call [phone] Lab Results  Component Value Date   HGB 10.3* 11/19/2014   HGB 11.7* 11/18/2014   HGB 12.3 11/17/2014   HCT 31.0* 11/19/2014   HCT 34.1* 11/18/2014   HCT 37.2 11/17/2014   ALKPHOS 193* 11/19/2014   ALKPHOS 218* 11/18/2014   ALKPHOS 169* 11/17/2014   AST 324* 11/19/2014   AST 1044* 11/18/2014   AST 295* 11/17/2014   ALT 710* 11/19/2014   ALT 1251* 11/18/2014   ALT 166* 11/17/2014

## 2014-11-19 NOTE — Progress Notes (Signed)
PROGRESS NOTE  Norma Walton ZOX:[id number] DOB: 1950-03-07 DOA: 11/17/2014 PCP: No primary care provider on file.  Assessment/Plan: CBD stone:  -S/p ERCP on 11/18/2014, procedure performed by by Dr. Dulce Sellar - GI recommending no ASA/NSAIDs for 5 days post-procedure. -Plan for antimicrobial therapy x 10 days, he is currently on ciprofloxacin 400 mg IV every 12 hours and Flagyl 500 mg IV every 8 hours -She remains on clear liquid diet, will leave further recommendations from GI regarding the advancement of her diet.  Elevated liver enzymes -Status post ERCP with stone extraction on 11/18/2014 -Her AST/ALT, down from 1044 and 1002 and 51-324 and 710 respectively. Bilirubin stable at 3.6. -Continue supportive care  IBS  Episodes of hypoglycemia -Sugar stable this morning -Diet likely to be advanced.  Leukocytosis -mild, monitor   Code Status: full Family Communication: patient Disposition Plan:    Consultants:  GI  Procedures:  MRCP  ERCP    HPI/Subjective: She denies fevers or chills, states having episode of nausea however did not vomit this morning. She is on clears   Objective: Filed Vitals:   11/19/14 0825  BP:   Pulse: 69  Temp:   Resp:     Intake/Output Summary (Last 24 hours) at 11/19/14 0937 Last data filed at 11/19/14 0929  Gross per 24 hour  Intake    590 ml  Output      2 ml  Net    588 ml   Filed Weights   11/17/14 1055 11/17/14 2000  Weight: 69.854 kg (154 lb) 68.7 kg (151 lb 7.3 oz)    Exam:   General:  Awake, NAD  Cardiovascular: rrr  Respiratory: clear  Abdomen: +BS, soft  Musculoskeletal: no edema   Data Reviewed: Basic Metabolic Panel:  Recent Labs Lab 11/17/14 1130 11/18/14 0443 11/19/14 0524  NA 136 137 142  K 4.1 3.6 3.7  CL 105 105 112*  CO2 23 20* 24  GLUCOSE 111* 110* 94  BUN 23* 14 6  CREATININE 0.78 0.74 0.69  CALCIUM 8.9 8.5* 8.4*   Liver Function Tests:  Recent Labs Lab 11/17/14 1130  11/18/14 0443 11/19/14 0524  AST 295* 1044* 324*  ALT 166* 1251* 710*  ALKPHOS 169* 218* 193*  BILITOT 0.8 3.6* 3.6*  PROT 6.2* 5.8* 5.5*  ALBUMIN 3.7 3.2* 3.0*    Recent Labs Lab 11/17/14 1130  LIPASE 42   No results for input(s): AMMONIA in the last 168 hours. CBC:  Recent Labs Lab 11/17/14 1130 11/18/14 0443 11/19/14 0524  WBC 6.6 11.7* 3.5*  NEUTROABS 5.0  --   --   HGB 12.3 11.7* 10.3*  HCT 37.2 34.1* 31.0*  MCV 85.5 83.8 86.6  PLT 157 138* 106*   Cardiac Enzymes:  Recent Labs Lab 11/17/14 1130 11/17/14 1609  TROPONINI <0.03 <0.03   BNP (last 3 results) No results for input(s): BNP in the last 8760 hours.  ProBNP (last 3 results) No results for input(s): PROBNP in the last 8760 hours.  CBG:  Recent Labs Lab 11/17/14 2024 11/18/14 1240 11/18/14 1726 11/18/14 2121 11/19/14 0823  GLUCAP 117* 79 106* 82 98    No results found for this or any previous visit (from the past 240 hour(s)).   Studies: Dg Chest 2 View  11/17/2014   CLINICAL DATA:  Intermittent chest pain.  EXAM: CHEST  2 VIEW  COMPARISON:  None.  FINDINGS: The heart size and mediastinal contours are within normal limits. Both lungs are clear. The visualized skeletal structures are unremarkable.  IMPRESSION: No active cardiopulmonary disease.   Electronically Signed   By: Elige Ko   On: 11/17/2014 12:07   US Abdomen Complete  11/17/2014   CLINICAL DATA:  Abdominal pain, lupus, COPD, irritable bowel syndrome, GERD, former smoker  EXAM: ULTRASOUND ABDOMEN COMPLETE  COMPARISON:  CT abdomen pelvis 09/08/2014  FINDINGS: Gallbladder: Surgically absent  Common bile duct: Diameter: Dilated 16 mm diameter, measured 13 mm on prior CT.  Liver: Upper normal parenchymal echogenicity. Intrahepatic biliary dilatation. No definite hepatic mass lesion or nodularity. Hepatopetal portal venous flow.  IVC: Normal appearance  Pancreas: Normal appearance  Spleen: Normal appearance, 5.0 cm length  Right Kidney:  Length: 8.7 cm. Normal cortical thickness. Slightly increased cortical echogenicity. No mass or hydronephrosis.  Left Kidney: Length: 10.2 cm. Normal cortical thickness. Slightly increased cortical echogenicity. No solid mass or hydronephrosis.  Abdominal aorta: Normal caliber.  Other findings: No free fluid  IMPRESSION: Intrahepatic and extrahepatic biliary dilatation with CBD 16 mm diameter, measured 13 mm on prior CT exam; recommend correlation with LFTs.  Probable medical renal disease changes of both kidneys.  No additional sonographic abnormalities identified.   Electronically Signed   By: Ulyses Southward M.D.   On: 11/17/2014 16:19   Mr 3d Recon At Scanner  11/18/2014   CLINICAL DATA:  Severe epigastric abdominal pain this morning. History of prior cholecystectomy. Recent ultrasound examination showing biliary dilatation.  EXAM: MRI ABDOMEN WITHOUT AND WITH CONTRAST (INCLUDING MRCP)  TECHNIQUE: Multiplanar multisequence MR imaging of the abdomen was performed both before and after the administration of intravenous contrast. Heavily T2-weighted images of the biliary and pancreatic ducts were obtained, and three-dimensional MRCP images were rendered by post processing.  CONTRAST:  14mL MULTIHANCE GADOBENATE DIMEGLUMINE 529 MG/ML IV SOLN  COMPARISON:  Ultrasound 11/17/2014 and CT scan 09/08/2014.  FINDINGS: Lower chest: The lung bases are grossly clear. No obvious infiltrate or effusion. No pericardial effusion.  Hepatobiliary: No focal hepatic lesions or intrahepatic biliary dilatation. Stable intrahepatic biliary dilatation. Stable common bile duct dilatation. The common bile duct measures 12 mm in the head of the pancreas. There is a distal common bile duct stone measuring 8.5 mm.  Pancreas: No mass, inflammation or pancreatic ductal dilatation.  Spleen: Normal size.  No focal lesions.  Adrenals/Urinary Tract: The adrenal glands and kidneys are unremarkable. There is a simple left renal cyst.  Stomach/Bowel:  The stomach, duodenum, visualized small bowel and visualized colon are unremarkable. No inflammatory changes, mass lesions or obstructive findings.  Vascular/Lymphatic: The aorta and branch vessels are patent. Scattered atherosclerotic calcifications the major venous structures are patent.  Other: No abdominal wall hernia, ascites or subcutaneous lesions.  Musculoskeletal: No significant bony findings.  IMPRESSION: 1. Intra and extra hepatic biliary dilatation appears relatively stable when compared to prior CT scan and ultrasound. 2. 8.5 mm distal common bile duct stone. 3. Status post cholecystectomy. 4. Normal pancreas.  Normal caliber pancreatic duct.   Electronically Signed   By: Rudie Meyer M.D.   On: 11/18/2014 08:21   Dg Ercp With Sphincterotomy  11/18/2014   CLINICAL DATA:  64 year old female with a history of sphincterotomy, choledocholithiasis  EXAM: ERCP  TECHNIQUE: Multiple spot images obtained with the fluoroscopic device and submitted for interpretation post-procedure.  FLUOROSCOPY TIME:  Fluoroscopy Time:  4 minutes 21 seconds  COMPARISON:  None.  FINDINGS: Initial images demonstrate endoscope within the upper abdomen and cannulation of the ampulla.  Subsequently there is retrograde infusion of contrast with partial opacification of the extrahepatic  biliary system. Surgical changes of cholecystectomy are present.  Balloon basket present within the biliary system.  IMPRESSION: Limited images during ERCP demonstrate balloon basket within a partially opacified extrahepatic biliary system.  Changes of cholecystectomy.  Please refer to the dictated operative report for full details of intraoperative findings and procedure.  Signed,  Yvone Neu. Loreta Ave, DO  [facility]  [facility]   Electronically Signed   By: Gilmer Mor D.O.   On: 11/18/2014 09:52   Mr Abd W/wo Cm/mrcp  11/18/2014   CLINICAL DATA:  Severe epigastric abdominal pain this morning.  History of prior cholecystectomy. Recent ultrasound examination showing biliary dilatation.  EXAM: MRI ABDOMEN WITHOUT AND WITH CONTRAST (INCLUDING MRCP)  TECHNIQUE: Multiplanar multisequence MR imaging of the abdomen was performed both before and after the administration of intravenous contrast. Heavily T2-weighted images of the biliary and pancreatic ducts were obtained, and three-dimensional MRCP images were rendered by post processing.  CONTRAST:  14mL MULTIHANCE GADOBENATE DIMEGLUMINE 529 MG/ML IV SOLN  COMPARISON:  Ultrasound 11/17/2014 and CT scan 09/08/2014.  FINDINGS: Lower chest: The lung bases are grossly clear. No obvious infiltrate or effusion. No pericardial effusion.  Hepatobiliary: No focal hepatic lesions or intrahepatic biliary dilatation. Stable intrahepatic biliary dilatation. Stable common bile duct dilatation. The common bile duct measures 12 mm in the head of the pancreas. There is a distal common bile duct stone measuring 8.5 mm.  Pancreas: No mass, inflammation or pancreatic ductal dilatation.  Spleen: Normal size.  No focal lesions.  Adrenals/Urinary Tract: The adrenal glands and kidneys are unremarkable. There is a simple left renal cyst.  Stomach/Bowel: The stomach, duodenum, visualized small bowel and visualized colon are unremarkable. No inflammatory changes, mass lesions or obstructive findings.  Vascular/Lymphatic: The aorta and branch vessels are patent. Scattered atherosclerotic calcifications the major venous structures are patent.  Other: No abdominal wall hernia, ascites or subcutaneous lesions.  Musculoskeletal: No significant bony findings.  IMPRESSION: 1. Intra and extra hepatic biliary dilatation appears relatively stable when compared to prior CT scan and ultrasound. 2. 8.5 mm distal common bile duct stone. 3. Status post cholecystectomy. 4. Normal pancreas.  Normal caliber pancreatic duct.   Electronically Signed   By: Rudie Meyer M.D.   On: 11/18/2014 08:21     Scheduled Meds: . ciprofloxacin  400 mg Intravenous Q12H  . heparin  5,000 Units Subcutaneous 3 times per day  . hydroxychloroquine  200 mg Oral BID  . levothyroxine  100 mcg Oral QAC breakfast  . metronidazole  500 mg Intravenous Q8H  . simvastatin  40 mg Oral q1800   Continuous Infusions:   Antibiotics Given (last 72 hours)    Date/Time Action Medication Dose Rate   11/17/14 2240 Given   hydroxychloroquine (PLAQUENIL) tablet 200 mg 200 mg    11/18/14 0647 Given   ciprofloxacin (CIPRO) IVPB 400 mg 400 mg 200 mL/hr   11/18/14 0647 Given   metroNIDAZOLE (FLAGYL) IVPB 500 mg 500 mg 100 mL/hr   11/18/14 1103 Given   hydroxychloroquine (PLAQUENIL) tablet 200 mg 200 mg    11/18/14 1355 Given   metroNIDAZOLE (FLAGYL) IVPB 500 mg 500 mg 100 mL/hr   11/18/14 1814 Given   ciprofloxacin (CIPRO) IVPB 400 mg 400 mg 200 mL/hr   11/18/14 2226 Given   metroNIDAZOLE (FLAGYL) IVPB 500 mg 500 mg 100 mL/hr   11/18/14 2226 Given   hydroxychloroquine (PLAQUENIL) tablet 200 mg 200 mg    11/19/14 0552 Given   metroNIDAZOLE (FLAGYL)  IVPB 500 mg 500 mg 100 mL/hr   11/19/14 0701 Given   ciprofloxacin (CIPRO) IVPB 400 mg 400 mg 200 mL/hr   11/19/14 1610 Given   hydroxychloroquine (PLAQUENIL) tablet 200 mg 200 mg       Active Problems:   Abdominal pain   Common bile duct (CBD) obstruction   Elevated liver enzymes    Time spent: 25 min    Jeralyn Bennett  [facility] Pager [phone]. If 7PM-7AM, please contact night-coverage at [email or web address], password [facility] 11/19/2014, 9:37 AM  LOS: 2 days

## 2014-11-20 LAB — CBC
HCT: 34 % — ABNORMAL LOW (ref 36.0–46.0)
Hemoglobin: 11.4 g/dL — ABNORMAL LOW (ref 12.0–15.0)
MCH: 28.4 pg (ref 26.0–34.0)
MCHC: 33.5 g/dL (ref 30.0–36.0)
MCV: 84.6 fL (ref 78.0–100.0)
PLATELETS: 129 10*3/uL — AB (ref 150–400)
RBC: 4.02 MIL/uL (ref 3.87–5.11)
RDW: 14.4 % (ref 11.5–15.5)
WBC: 3.5 10*3/uL — ABNORMAL LOW (ref 4.0–10.5)

## 2014-11-20 LAB — COMPREHENSIVE METABOLIC PANEL
ALBUMIN: 3 g/dL — AB (ref 3.5–5.0)
ALK PHOS: 242 U/L — AB (ref 38–126)
ALT: 535 U/L — AB (ref 14–54)
AST: 179 U/L — ABNORMAL HIGH (ref 15–41)
Anion gap: 13 (ref 5–15)
BILIRUBIN TOTAL: 2.6 mg/dL — AB (ref 0.3–1.2)
BUN: 7 mg/dL (ref 6–20)
CALCIUM: 8.6 mg/dL — AB (ref 8.9–10.3)
CO2: 22 mmol/L (ref 22–32)
CREATININE: 0.7 mg/dL (ref 0.44–1.00)
Chloride: 103 mmol/L (ref 101–111)
GFR calc Af Amer: >60 mL/min (ref >60)
GFR calc non Af Amer: >60 mL/min (ref >60)
GLUCOSE: 85 mg/dL (ref 65–99)
Potassium: 3.4 mmol/L — ABNORMAL LOW (ref 3.5–5.1)
SODIUM: 138 mmol/L (ref 135–145)
TOTAL PROTEIN: 6 g/dL — AB (ref 6.5–8.1)

## 2014-11-20 LAB — GLUCOSE, CAPILLARY
GLUCOSE-CAPILLARY: 86 mg/dL (ref 65–99)
Glucose-Capillary: 92 mg/dL (ref 65–99)
Glucose-Capillary: 100 mg/dL — ABNORMAL HIGH (ref 65–99)
Glucose-Capillary: 91 mg/dL (ref 65–99)

## 2014-11-20 MED ORDER — METRONIDAZOLE 500 MG PO TABS
500.0000 mg | ORAL_TABLET | Freq: Three times a day (TID) | ORAL | Status: DC
  Administered 2014-11-20 – 2014-11-22 (×5): 500 mg via ORAL
  Filled 2014-11-20 (×5): qty 1

## 2014-11-20 MED ORDER — CIPROFLOXACIN HCL 500 MG PO TABS
500.0000 mg | ORAL_TABLET | Freq: Two times a day (BID) | ORAL | Status: DC
  Administered 2014-11-20 – 2014-11-22 (×4): 500 mg via ORAL
  Filled 2014-11-20 (×4): qty 1

## 2014-11-20 MED ORDER — ONDANSETRON HCL 4 MG/2ML IJ SOLN
4.0000 mg | Freq: Four times a day (QID) | INTRAMUSCULAR | Status: DC | PRN
  Administered 2014-11-20 (×3): 4 mg via INTRAVENOUS
  Filled 2014-11-20 (×3): qty 2

## 2014-11-20 NOTE — Progress Notes (Signed)
PROGRESS NOTE  Norma Walton ZOX:[id number] DOB: Jul 17, 1950 DOA: 11/17/2014 PCP: No primary care provider on file.  Assessment/Plan: CBD stone:  -S/p ERCP on 11/18/2014, procedure performed by by Dr. Dulce Sellar - GI recommending no ASA/NSAIDs for 5 days post-procedure. -Plan for antimicrobial therapy x 10 days, she is currently on ciprofloxacin 400 mg IV every 12 hours and Flagyl 500 mg IV every 8 hours -diet advanced to full liquids-- patient complaining of vomiting and nausea  Elevated liver enzymes -Status post ERCP with stone extraction on 11/18/2014 -Her AST/ALT, down from 1044 and 1002 and 51-324 and 710 respectively. Bilirubin stable at 3.6. -Continue supportive care  IBS  Episodes of hypoglycemia -Sugar stable this morning -Diet likely to be advanced.  Leukocytosis -mild, monitor   Code Status: full Family Communication: patient Disposition Plan:    Consultants:  GI  Procedures:  MRCP  ERCP    HPI/Subjective: Continued nausea with vomiting Is passing gas  Objective: Filed Vitals:   11/20/14 0516  BP: 116/47  Pulse: 73  Temp: 98.9 F (37.2 C)  Resp: 16    Intake/Output Summary (Last 24 hours) at 11/20/14 1216 Last data filed at 11/20/14 1214  Gross per 24 hour  Intake   1120 ml  Output      2 ml  Net   1118 ml   Filed Weights   11/17/14 1055 11/17/14 2000  Weight: 69.854 kg (154 lb) 68.7 kg (151 lb 7.3 oz)    Exam:   General:  Awake, NAD  Cardiovascular: rrr  Respiratory: clear  Abdomen: +BS, soft  Musculoskeletal: no edema   Data Reviewed: Basic Metabolic Panel:  Recent Labs Lab 11/17/14 1130 11/18/14 0443 11/19/14 0524 11/20/14 0521  NA 136 137 142 138  K 4.1 3.6 3.7 3.4*  CL 105 105 112* 103  CO2 23 20* 24 22  GLUCOSE 111* 110* 94 85  BUN 23* CREATININE 0.78 0.74 0.69 0.70  CALCIUM 8.9 8.5* 8.4* 8.6*   Liver Function Tests:  Recent Labs Lab 11/17/14 1130 11/18/14 0443 11/19/14 0524 11/20/14 0521    AST 295* 1044* 324* 179*  ALT 166* 1251* 710* 535*  ALKPHOS 169* 218* 193* 242*  BILITOT 0.8 3.6* 3.6* 2.6*  PROT 6.2* 5.8* 5.5* 6.0*  ALBUMIN 3.7 3.2* 3.0* 3.0*    Recent Labs Lab 11/17/14 1130  LIPASE 42   No results for input(s): AMMONIA in the last 168 hours. CBC:  Recent Labs Lab 11/17/14 1130 11/18/14 0443 11/19/14 0524 11/20/14 0521  WBC 6.6 11.7* 3.5* 3.5*  NEUTROABS 5.0  --   --   --   HGB 12.3 11.7* 10.3* 11.4*  HCT 37.2 34.1* 31.0* 34.0*  MCV 85.5 83.8 86.6 84.6  PLT 157 138* 106* 129*   Cardiac Enzymes:  Recent Labs Lab 11/17/14 1130 11/17/14 1609  TROPONINI <0.03 <0.03   BNP (last 3 results) No results for input(s): BNP in the last 8760 hours.  ProBNP (last 3 results) No results for input(s): PROBNP in the last 8760 hours.  CBG:  Recent Labs Lab 11/19/14 0823 11/19/14 1229 11/19/14 1727 11/19/14 2159 11/20/14 0807  GLUCAP 98 89 79 91 86    No results found for this or any previous visit (from the past 240 hour(s)).   Studies: No results found.  Scheduled Meds: . ciprofloxacin  400 mg Intravenous Q12H  . heparin  5,000 Units Subcutaneous 3 times per day  . hydroxychloroquine  200 mg Oral BID  . levothyroxine  100  mcg Oral QAC breakfast  . metronidazole  500 mg Intravenous Q8H  . pantoprazole  40 mg Oral Daily  . simvastatin  40 mg Oral q1800   Continuous Infusions:   Antibiotics Given (last 72 hours)    Date/Time Action Medication Dose Rate   11/17/14 2240 Given   hydroxychloroquine (PLAQUENIL) tablet 200 mg 200 mg    11/18/14 0647 Given   ciprofloxacin (CIPRO) IVPB 400 mg 400 mg 200 mL/hr   11/18/14 0647 Given   metroNIDAZOLE (FLAGYL) IVPB 500 mg 500 mg 100 mL/hr   11/18/14 1103 Given   hydroxychloroquine (PLAQUENIL) tablet 200 mg 200 mg    11/18/14 1355 Given   metroNIDAZOLE (FLAGYL) IVPB 500 mg 500 mg 100 mL/hr   11/18/14 1814 Given   ciprofloxacin (CIPRO) IVPB 400 mg 400 mg 200 mL/hr   11/18/14 2226 Given    metroNIDAZOLE (FLAGYL) IVPB 500 mg 500 mg 100 mL/hr   11/18/14 2226 Given   hydroxychloroquine (PLAQUENIL) tablet 200 mg 200 mg    11/19/14 0552 Given   metroNIDAZOLE (FLAGYL) IVPB 500 mg 500 mg 100 mL/hr   11/19/14 0701 Given   ciprofloxacin (CIPRO) IVPB 400 mg 400 mg 200 mL/hr   11/19/14 1610 Given   hydroxychloroquine (PLAQUENIL) tablet 200 mg 200 mg    11/19/14 1526 Given   metroNIDAZOLE (FLAGYL) IVPB 500 mg 500 mg 100 mL/hr   11/19/14 1803 Given   ciprofloxacin (CIPRO) IVPB 400 mg 400 mg 200 mL/hr   11/19/14 2204 Given   hydroxychloroquine (PLAQUENIL) tablet 200 mg 200 mg    11/19/14 2205 Given   metroNIDAZOLE (FLAGYL) IVPB 500 mg 500 mg 100 mL/hr   11/20/14 0526 Given   ciprofloxacin (CIPRO) IVPB 400 mg 400 mg 200 mL/hr   11/20/14 9604 Given   metroNIDAZOLE (FLAGYL) IVPB 500 mg 500 mg 100 mL/hr   11/20/14 1028 Given   hydroxychloroquine (PLAQUENIL) tablet 200 mg 200 mg       Active Problems:   Abdominal pain   Common bile duct (CBD) obstruction   Elevated liver enzymes    Time spent: 25 min    Jeromiah Ohalloran  [facility] Pager [phone]. If 7PM-7AM, please contact night-coverage at [email or web address], password [facility] 11/20/2014, 12:16 PM  LOS: 3 days

## 2014-11-21 DIAGNOSIS — K831 Obstruction of bile duct: Secondary | ICD-10-CM

## 2014-11-21 DIAGNOSIS — E876 Hypokalemia: Secondary | ICD-10-CM | POA: Diagnosis not present

## 2014-11-21 DIAGNOSIS — K589 Irritable bowel syndrome without diarrhea: Secondary | ICD-10-CM | POA: Diagnosis present

## 2014-11-21 DIAGNOSIS — E785 Hyperlipidemia, unspecified: Secondary | ICD-10-CM | POA: Diagnosis present

## 2014-11-21 LAB — COMPREHENSIVE METABOLIC PANEL
ALBUMIN: 3.2 g/dL — AB (ref 3.5–5.0)
ALK PHOS: 277 U/L — AB (ref 38–126)
ALT: 410 U/L — AB (ref 14–54)
AST: 140 U/L — AB (ref 15–41)
Anion gap: 11 (ref 5–15)
BUN: 7 mg/dL (ref 6–20)
CALCIUM: 8.9 mg/dL (ref 8.9–10.3)
CO2: 25 mmol/L (ref 22–32)
CREATININE: 0.73 mg/dL (ref 0.44–1.00)
Chloride: 104 mmol/L (ref 101–111)
GFR calc Af Amer: >60 mL/min (ref >60)
GFR calc non Af Amer: >60 mL/min (ref >60)
GLUCOSE: 86 mg/dL (ref 65–99)
Potassium: 3.1 mmol/L — ABNORMAL LOW (ref 3.5–5.1)
SODIUM: 140 mmol/L (ref 135–145)
Total Bilirubin: 1.4 mg/dL — ABNORMAL HIGH (ref 0.3–1.2)
Total Protein: 5.8 g/dL — ABNORMAL LOW (ref 6.5–8.1)

## 2014-11-21 LAB — GLUCOSE, CAPILLARY
GLUCOSE-CAPILLARY: 91 mg/dL (ref 65–99)
GLUCOSE-CAPILLARY: 105 mg/dL — AB (ref 65–99)
Glucose-Capillary: 103 mg/dL — ABNORMAL HIGH (ref 65–99)
Glucose-Capillary: 104 mg/dL — ABNORMAL HIGH (ref 65–99)

## 2014-11-21 LAB — CBC
HCT: 35.2 % — ABNORMAL LOW (ref 36.0–46.0)
HEMOGLOBIN: 11.7 g/dL — AB (ref 12.0–15.0)
MCH: 27.9 pg (ref 26.0–34.0)
MCHC: 33.2 g/dL (ref 30.0–36.0)
MCV: 83.8 fL (ref 78.0–100.0)
PLATELETS: 146 10*3/uL — AB (ref 150–400)
RBC: 4.2 MIL/uL (ref 3.87–5.11)
RDW: 14.6 % (ref 11.5–15.5)
WBC: 3.6 10*3/uL — ABNORMAL LOW (ref 4.0–10.5)

## 2014-11-21 LAB — MAGNESIUM: Magnesium: 1.9 mg/dL (ref 1.7–2.4)

## 2014-11-21 MED ORDER — ZOLPIDEM TARTRATE 5 MG PO TABS
5.0000 mg | ORAL_TABLET | Freq: Once | ORAL | Status: AC
  Administered 2014-11-21: 5 mg via ORAL
  Filled 2014-11-21: qty 1

## 2014-11-21 MED ORDER — MAGNESIUM HYDROXIDE 400 MG/5ML PO SUSP
30.0000 mL | Freq: Once | ORAL | Status: AC
  Administered 2014-11-21: 30 mL via ORAL
  Filled 2014-11-21: qty 30

## 2014-11-21 MED ORDER — ACETAMINOPHEN 325 MG PO TABS
650.0000 mg | ORAL_TABLET | Freq: Four times a day (QID) | ORAL | Status: DC | PRN
  Administered 2014-11-21: 650 mg via ORAL
  Filled 2014-11-21: qty 2

## 2014-11-21 MED ORDER — POTASSIUM CHLORIDE 10 MEQ/100ML IV SOLN
10.0000 meq | INTRAVENOUS | Status: DC
  Filled 2014-11-21: qty 100

## 2014-11-21 MED ORDER — BLISTEX MEDICATED EX OINT
TOPICAL_OINTMENT | CUTANEOUS | Status: DC | PRN
  Filled 2014-11-21: qty 10

## 2014-11-21 MED ORDER — POTASSIUM CHLORIDE CRYS ER 20 MEQ PO TBCR
40.0000 meq | EXTENDED_RELEASE_TABLET | Freq: Once | ORAL | Status: AC
  Administered 2014-11-21: 40 meq via ORAL
  Filled 2014-11-21: qty 2

## 2014-11-21 NOTE — Progress Notes (Signed)
Pt. Unable to tolerate IV potassium, stopped transfusion and paged Dr. Lendell Caprice.  Will continue to monitor and await for new orders or return call.  Forbes Cellar, RN

## 2014-11-21 NOTE — Progress Notes (Signed)
PROGRESS NOTE  Norma Walton ZOX:[id number] DOB: June 02, 1950 DOA: 11/17/2014 PCP: No primary care provider on file.  Assessment/Plan: CBD stone/Elevated liver enzymes -S/p ERCP on 11/18/2014, procedure performed by by Dr. Dulce Sellar - GI recommending no ASA/NSAIDs for 5 days post-procedure. Advance to solids. Now on PO abx. Hold statin for now. Increase activity. Home tomorrow if tolerates solids.  IBS Constipation dominant. Requesting milk of magnesia. Have ordered.  Episodes of hypoglycemia None documented. Discontinue CBGs.  Hypokalemia: Replete IV and check magnesium. Recheck in the morning  Code Status: full Family Communication: patient is lucid Disposition Plan:    Consultants:  GI  Procedures:  MRCP  ERCP    HPI/Subjective: Able to tolerate oatmeal today. Feels less nauseated overall but not completely resolved. No vomiting. No pain.  Objective: Filed Vitals:   11/21/14 0533  BP: 118/54  Pulse: 73  Temp: 98.5 F (36.9 C)  Resp: 14    Intake/Output Summary (Last 24 hours) at 11/21/14 1138 Last data filed at 11/21/14 0945  Gross per 24 hour  Intake   1360 ml  Output      0 ml  Net   1360 ml   Filed Weights   11/17/14 1055 11/17/14 2000  Weight: 69.854 kg (154 lb) 68.7 kg (151 lb 7.3 oz)    Exam:   General:  Awake, NAD  Cardiovascular: rrr  Respiratory: clear  Abdomen: +BS, soft, nontender nondistended  Musculoskeletal: no edema   Data Reviewed: Basic Metabolic Panel:  Recent Labs Lab 11/17/14 1130 11/18/14 0443 11/19/14 0524 11/20/14 0521 11/21/14 0451  NA 136 137 142 138 140  K 4.1 3.6 3.7 3.4* 3.1*  CL 105 105 112* 103 104  CO2 23 20* GLUCOSE 111* 110* 94 85 86  BUN 23* CREATININE 0.78 0.74 0.69 0.70 0.73  CALCIUM 8.9 8.5* 8.4* 8.6* 8.9   Liver Function Tests:  Recent Labs Lab 11/17/14 1130 11/18/14 0443 11/19/14 0524 11/20/14 0521 11/21/14 0451  AST 295* 1044* 324* 179* 140*  ALT 166* 1251*  710* 535* 410*  ALKPHOS 169* 218* 193* 242* 277*  BILITOT 0.8 3.6* 3.6* 2.6* 1.4*  PROT 6.2* 5.8* 5.5* 6.0* 5.8*  ALBUMIN 3.7 3.2* 3.0* 3.0* 3.2*    Recent Labs Lab 11/17/14 1130  LIPASE 42   No results for input(s): AMMONIA in the last 168 hours. CBC:  Recent Labs Lab 11/17/14 1130 11/18/14 0443 11/19/14 0524 11/20/14 0521 11/21/14 0451  WBC 6.6 11.7* 3.5* 3.5* 3.6*  NEUTROABS 5.0  --   --   --   --   HGB 12.3 11.7* 10.3* 11.4* 11.7*  HCT 37.2 34.1* 31.0* 34.0* 35.2*  MCV 85.5 83.8 86.6 84.6 83.8  PLT 157 138* 106* 129* 146*   Cardiac Enzymes:  Recent Labs Lab 11/17/14 1130 11/17/14 1609  TROPONINI <0.03 <0.03   BNP (last 3 results) No results for input(s): BNP in the last 8760 hours.  ProBNP (last 3 results) No results for input(s): PROBNP in the last 8760 hours.  CBG:  Recent Labs Lab 11/20/14 0807 11/20/14 1214 11/20/14 1640 11/20/14 2211 11/21/14 0750  GLUCAP 86 92 100* 91 91    No results found for this or any previous visit (from the past 240 hour(s)).   Studies: No results found.  Scheduled Meds: . ciprofloxacin  500 mg Oral BID  . heparin  5,000 Units Subcutaneous 3 times per day  . hydroxychloroquine  200 mg Oral BID  . levothyroxine  100 mcg Oral QAC breakfast  . magnesium hydroxide  30 mL Oral Once  . metroNIDAZOLE  500 mg Oral 3 times per day  . pantoprazole  40 mg Oral Daily  . potassium chloride  10 mEq Intravenous Q1 Hr x 4   Continuous Infusions:   Antibiotics Given (last 72 hours)    Date/Time Action Medication Dose Rate   11/18/14 1355 Given   metroNIDAZOLE (FLAGYL) IVPB 500 mg 500 mg 100 mL/hr   11/18/14 1814 Given   ciprofloxacin (CIPRO) IVPB 400 mg 400 mg 200 mL/hr   11/18/14 2226 Given   metroNIDAZOLE (FLAGYL) IVPB 500 mg 500 mg 100 mL/hr   11/18/14 2226 Given   hydroxychloroquine (PLAQUENIL) tablet 200 mg 200 mg    11/19/14 0552 Given   metroNIDAZOLE (FLAGYL) IVPB 500 mg 500 mg 100 mL/hr   11/19/14 0701 Given     ciprofloxacin (CIPRO) IVPB 400 mg 400 mg 200 mL/hr   11/19/14 1610 Given   hydroxychloroquine (PLAQUENIL) tablet 200 mg 200 mg    11/19/14 1526 Given   metroNIDAZOLE (FLAGYL) IVPB 500 mg 500 mg 100 mL/hr   11/19/14 1803 Given   ciprofloxacin (CIPRO) IVPB 400 mg 400 mg 200 mL/hr   11/19/14 2204 Given   hydroxychloroquine (PLAQUENIL) tablet 200 mg 200 mg    11/19/14 2205 Given   metroNIDAZOLE (FLAGYL) IVPB 500 mg 500 mg 100 mL/hr   11/20/14 0526 Given   ciprofloxacin (CIPRO) IVPB 400 mg 400 mg 200 mL/hr   11/20/14 0717 Given   metroNIDAZOLE (FLAGYL) IVPB 500 mg 500 mg 100 mL/hr   11/20/14 1028 Given   hydroxychloroquine (PLAQUENIL) tablet 200 mg 200 mg    11/20/14 1400 Given   metroNIDAZOLE (FLAGYL) IVPB 500 mg 500 mg 100 mL/hr   11/20/14 1943 Given   ciprofloxacin (CIPRO) tablet 500 mg 500 mg    11/20/14 2135 Given   metroNIDAZOLE (FLAGYL) tablet 500 mg 500 mg    11/20/14 2136 Given   hydroxychloroquine (PLAQUENIL) tablet 200 mg 200 mg    11/21/14 9604 Given   metroNIDAZOLE (FLAGYL) tablet 500 mg 500 mg    11/21/14 5409 Given   hydroxychloroquine (PLAQUENIL) tablet 200 mg 200 mg    11/21/14 8119 Given   ciprofloxacin (CIPRO) tablet 500 mg 500 mg       Active Problems:   Abdominal pain   Common bile duct (CBD) obstruction   Elevated liver enzymes    Time spent: 25 min    Savonna Birchmeier L  [facility] [email or web address], password [facility] 11/21/2014, 11:38 AM  LOS: 4 days

## 2014-11-22 ENCOUNTER — Encounter (HOSPITAL_COMMUNITY): Payer: Self-pay | Admitting: Gastroenterology

## 2014-11-22 DIAGNOSIS — K589 Irritable bowel syndrome without diarrhea: Secondary | ICD-10-CM

## 2014-11-22 LAB — COMPREHENSIVE METABOLIC PANEL
ALBUMIN: 3.1 g/dL — AB (ref 3.5–5.0)
ALT: 322 U/L — ABNORMAL HIGH (ref 14–54)
AST: 110 U/L — AB (ref 15–41)
Alkaline Phosphatase: 249 U/L — ABNORMAL HIGH (ref 38–126)
Anion gap: 11 (ref 5–15)
BUN: 11 mg/dL (ref 6–20)
CHLORIDE: 103 mmol/L (ref 101–111)
CO2: 25 mmol/L (ref 22–32)
Calcium: 9 mg/dL (ref 8.9–10.3)
Creatinine, Ser: 0.74 mg/dL (ref 0.44–1.00)
GFR calc Af Amer: >60 mL/min (ref >60)
GFR calc non Af Amer: >60 mL/min (ref >60)
GLUCOSE: 85 mg/dL (ref 65–99)
POTASSIUM: 3.7 mmol/L (ref 3.5–5.1)
SODIUM: 139 mmol/L (ref 135–145)
Total Bilirubin: 1.2 mg/dL (ref 0.3–1.2)
Total Protein: 6 g/dL — ABNORMAL LOW (ref 6.5–8.1)

## 2014-11-22 MED ORDER — METRONIDAZOLE 500 MG PO TABS: 500.0000 mg | ORAL_TABLET | Freq: Three times a day (TID) | ORAL | Status: AC

## 2014-11-22 MED ORDER — DOCUSATE SODIUM 100 MG PO CAPS
200.0000 mg | ORAL_CAPSULE | Freq: Two times a day (BID) | ORAL | Status: DC
  Administered 2014-11-22: 200 mg via ORAL
  Filled 2014-11-22: qty 2

## 2014-11-22 MED ORDER — CIPROFLOXACIN HCL 500 MG PO TABS: 500.0000 mg | ORAL_TABLET | Freq: Two times a day (BID) | ORAL | Status: AC

## 2014-11-22 MED ORDER — DOCUSATE SODIUM 100 MG PO CAPS: 200.0000 mg | ORAL_CAPSULE | Freq: Two times a day (BID) | ORAL | Status: DC

## 2014-11-22 NOTE — Discharge Summary (Signed)
Physician Discharge Summary  Allyah Heather ZOX:[id number] DOB: 29-Sep-1950 DOA: 11/17/2014  PCP: No primary care provider on file.  Admit date: 11/17/2014 Discharge date: 11/22/2014  Time spent: greater than 30 minutes  Recommendations for Outpatient Follow-up:  1. Monitor LFTs  Discharge Diagnoses:  Principal Problem:   Common bile duct (CBD) obstruction Active Problems:   Elevated liver enzymes   Hypokalemia   IBS (irritable bowel syndrome)   Hyperlipidemia   Discharge Condition: stable  Diet recommendation: heart healthy  Filed Weights   11/17/14 1055 11/17/14 2000  Weight: 69.854 kg (154 lb) 68.7 kg (151 lb 7.3 oz)    History of present illness:  64 y.o. female  With PMHX of cholecystectomy, sphincter of oti dilation. She is from [place], [place]. She has been in [place] for 7 months working. She plans to return to [facility] later this month.   She had several days of diarrhea but did not start feeling unwell until this AM when she was unable to eat and developed epigastric pain that radiated to her back. No fever, no chills. In the ER, she developed Nausea and vomiting.  No SOB, no CP  She was found to have elevated liver enzymes and U/S of abd showed increase diameter of CBD. GI was consulted who recommended pain control and outpatient follow up. Pain has not been controlled and she continued to vomit. GI then recommended MRCP.   Hospital Course:  CBD stone/Elevated liver enzymes MRCP showed CBD stone. Patient started on cipro, flagyl. had ERCP, sphincterotomy, stone removal on 11/18/2014, procedure performed by by Dr. Dulce Sellar Will complete a 10 day course antibiotic.  Diet advanced. By discharge, no abdominal pain, tolerating solids, LFTs improving  IBS Constipation dominant. Having bowel movements  Hypokalemia: Replete  Procedures:  ERCP, sphincterotomy, stone removal  Consultations:  GI  Discharge Exam: Filed Vitals:   11/22/14 0527  BP: 127/69  Pulse: 80   Temp: 98.3 F (36.8 C)  Resp: 18    General: a and o Cardiovascular: RRR Respiratory: CTA Abd: S, NT, ND  Discharge Instructions   Discharge Instructions    Activity as tolerated - No restrictions    Complete by:  As directed      Diet general    Complete by:  As directed           Current Discharge Medication List    START taking these medications   Details  ciprofloxacin (CIPRO) 500 MG tablet Take 1 tablet (500 mg total) by mouth 2 (two) times daily. Qty: 10 tablet, Refills: 0    metroNIDAZOLE (FLAGYL) 500 MG tablet Take 1 tablet (500 mg total) by mouth every 8 (eight) hours. Qty: 15 tablet, Refills: 0      CONTINUE these medications which have NOT CHANGED   Details  acetaminophen (TYLENOL) 650 MG CR tablet Take 650 mg by mouth every 8 (eight) hours as needed for pain.    aspirin EC 81 MG tablet Take 81 mg by mouth at bedtime.    hydroxychloroquine (PLAQUENIL) 200 MG tablet Take 200 mg by mouth 2 (two) times daily.     hydrOXYzine (ATARAX/VISTARIL) 25 MG tablet Take 25 mg by mouth every 4 (four) hours as needed for itching.     ibandronate (BONIVA) 150 MG tablet Take 150 mg by mouth every 30 (thirty) days. Take in the morning with a full glass of water, on an empty stomach, and do not take anything else by mouth or lie down for the next 30 min.  lansoprazole (PREVACID) 30 MG capsule Take 1 capsule (30 mg total) by mouth daily at 12 noon. Qty: 15 capsule, Refills: 0    levothyroxine (SYNTHROID, LEVOTHROID) 100 MCG tablet Take 100 mcg by mouth daily before breakfast.    Multiple Vitamins-Minerals (MULTIVITAMIN GUMMIES ADULT PO) Take 2 tablets by mouth at bedtime.     Omega-3 Fatty Acids (FISH OIL) 645 MG CAPS Take 645 mg by mouth at bedtime.    OVER THE COUNTER MEDICATION Take 1 tablet by mouth 2 (two) times daily. Viviscal Hair Growth Supplement    ranitidine (ZANTAC) 300 MG tablet Take 150 mg by mouth daily as needed for heartburn (acid reflux).     simvastatin (ZOCOR) 40 MG tablet Take 40 mg by mouth daily at 6 PM.     loperamide (IMODIUM) 2 MG capsule Take 1 capsule (2 mg total) by mouth 4 (four) times daily as needed for diarrhea or loose stools. Qty: 12 capsule, Refills: 0    ondansetron (ZOFRAN ODT) 8 MG disintegrating tablet Take 1 tablet (8 mg total) by mouth every 8 (eight) hours as needed for nausea or vomiting. Qty: 20 tablet, Refills: 0      STOP taking these medications     MAGNESIUM PO      oxyCODONE-acetaminophen (PERCOCET) 5-325 MG per tablet      sucralfate (CARAFATE) 1 G tablet        Allergies  Allergen Reactions  . Penicillins Anaphylaxis      . Other     Anesthetics:  causes the patient to not wake up easily and could not function properly   Follow-up Information    Follow up with Dr. Julien Nordmann.   Why:  When you return to [place] next week   Contact information:   [address] [place], [place] [address]  [phone]       The results of significant diagnostics from this hospitalization (including imaging, microbiology, ancillary and laboratory) are listed below for reference.    Significant Diagnostic Studies: Dg Chest 2 View  11/17/2014   CLINICAL DATA:  Intermittent chest pain.  EXAM: CHEST  2 VIEW  COMPARISON:  None.  FINDINGS: The heart size and mediastinal contours are within normal limits. Both lungs are clear. The visualized skeletal structures are unremarkable.  IMPRESSION: No active cardiopulmonary disease.   Electronically Signed   By: Elige Ko   On: 11/17/2014 12:07   US Abdomen Complete  11/17/2014   CLINICAL DATA:  Abdominal pain, lupus, COPD, irritable bowel syndrome, GERD, former smoker  EXAM: ULTRASOUND ABDOMEN COMPLETE  COMPARISON:  CT abdomen pelvis 09/08/2014  FINDINGS: Gallbladder: Surgically absent  Common bile duct: Diameter: Dilated 16 mm diameter, measured 13 mm on prior CT.  Liver: Upper normal parenchymal echogenicity. Intrahepatic biliary dilatation.  No definite hepatic mass lesion or nodularity. Hepatopetal portal venous flow.  IVC: Normal appearance  Pancreas: Normal appearance  Spleen: Normal appearance, 5.0 cm length  Right Kidney: Length: 8.7 cm. Normal cortical thickness. Slightly increased cortical echogenicity. No mass or hydronephrosis.  Left Kidney: Length: 10.2 cm. Normal cortical thickness. Slightly increased cortical echogenicity. No solid mass or hydronephrosis.  Abdominal aorta: Normal caliber.  Other findings: No free fluid  IMPRESSION: Intrahepatic and extrahepatic biliary dilatation with CBD 16 mm diameter, measured 13 mm on prior CT exam; recommend correlation with LFTs.  Probable medical renal disease changes of both kidneys.  No additional sonographic abnormalities identified.   Electronically Signed   By: Angelyn Punt.D.  On: 11/17/2014 16:19   Mr 3d Recon At Scanner  11/18/2014   CLINICAL DATA:  Severe epigastric abdominal pain this morning. History of prior cholecystectomy. Recent ultrasound examination showing biliary dilatation.  EXAM: MRI ABDOMEN WITHOUT AND WITH CONTRAST (INCLUDING MRCP)  TECHNIQUE: Multiplanar multisequence MR imaging of the abdomen was performed both before and after the administration of intravenous contrast. Heavily T2-weighted images of the biliary and pancreatic ducts were obtained, and three-dimensional MRCP images were rendered by post processing.  CONTRAST:  14mL MULTIHANCE GADOBENATE DIMEGLUMINE 529 MG/ML IV SOLN  COMPARISON:  Ultrasound 11/17/2014 and CT scan 09/08/2014.  FINDINGS: Lower chest: The lung bases are grossly clear. No obvious infiltrate or effusion. No pericardial effusion.  Hepatobiliary: No focal hepatic lesions or intrahepatic biliary dilatation. Stable intrahepatic biliary dilatation. Stable common bile duct dilatation. The common bile duct measures 12 mm in the head of the pancreas. There is a distal common bile duct stone measuring 8.5 mm.  Pancreas: No mass, inflammation or  pancreatic ductal dilatation.  Spleen: Normal size.  No focal lesions.  Adrenals/Urinary Tract: The adrenal glands and kidneys are unremarkable. There is a simple left renal cyst.  Stomach/Bowel: The stomach, duodenum, visualized small bowel and visualized colon are unremarkable. No inflammatory changes, mass lesions or obstructive findings.  Vascular/Lymphatic: The aorta and branch vessels are patent. Scattered atherosclerotic calcifications the major venous structures are patent.  Other: No abdominal wall hernia, ascites or subcutaneous lesions.  Musculoskeletal: No significant bony findings.  IMPRESSION: 1. Intra and extra hepatic biliary dilatation appears relatively stable when compared to prior CT scan and ultrasound. 2. 8.5 mm distal common bile duct stone. 3. Status post cholecystectomy. 4. Normal pancreas.  Normal caliber pancreatic duct.   Electronically Signed   By: Rudie Meyer M.D.   On: 11/18/2014 08:21   Dg Ercp With Sphincterotomy  11/18/2014   CLINICAL DATA:  64 year old female with a history of sphincterotomy, choledocholithiasis  EXAM: ERCP  TECHNIQUE: Multiple spot images obtained with the fluoroscopic device and submitted for interpretation post-procedure.  FLUOROSCOPY TIME:  Fluoroscopy Time:  4 minutes 21 seconds  COMPARISON:  None.  FINDINGS: Initial images demonstrate endoscope within the upper abdomen and cannulation of the ampulla.  Subsequently there is retrograde infusion of contrast with partial opacification of the extrahepatic biliary system. Surgical changes of cholecystectomy are present.  Balloon basket present within the biliary system.  IMPRESSION: Limited images during ERCP demonstrate balloon basket within a partially opacified extrahepatic biliary system.  Changes of cholecystectomy.  Please refer to the dictated operative report for full details of intraoperative findings and procedure.  Signed,  Yvone Neu. Loreta Ave, DO  [facility]   [facility]   Electronically Signed   By: Gilmer Mor D.O.   On: 11/18/2014 09:52   Mr Abd W/wo Cm/mrcp  11/18/2014   CLINICAL DATA:  Severe epigastric abdominal pain this morning. History of prior cholecystectomy. Recent ultrasound examination showing biliary dilatation.  EXAM: MRI ABDOMEN WITHOUT AND WITH CONTRAST (INCLUDING MRCP)  TECHNIQUE: Multiplanar multisequence MR imaging of the abdomen was performed both before and after the administration of intravenous contrast. Heavily T2-weighted images of the biliary and pancreatic ducts were obtained, and three-dimensional MRCP images were rendered by post processing.  CONTRAST:  14mL MULTIHANCE GADOBENATE DIMEGLUMINE 529 MG/ML IV SOLN  COMPARISON:  Ultrasound 11/17/2014 and CT scan 09/08/2014.  FINDINGS: Lower chest: The lung bases are grossly clear. No obvious infiltrate or effusion. No pericardial effusion.  Hepatobiliary: No focal hepatic  lesions or intrahepatic biliary dilatation. Stable intrahepatic biliary dilatation. Stable common bile duct dilatation. The common bile duct measures 12 mm in the head of the pancreas. There is a distal common bile duct stone measuring 8.5 mm.  Pancreas: No mass, inflammation or pancreatic ductal dilatation.  Spleen: Normal size.  No focal lesions.  Adrenals/Urinary Tract: The adrenal glands and kidneys are unremarkable. There is a simple left renal cyst.  Stomach/Bowel: The stomach, duodenum, visualized small bowel and visualized colon are unremarkable. No inflammatory changes, mass lesions or obstructive findings.  Vascular/Lymphatic: The aorta and branch vessels are patent. Scattered atherosclerotic calcifications the major venous structures are patent.  Other: No abdominal wall hernia, ascites or subcutaneous lesions.  Musculoskeletal: No significant bony findings.  IMPRESSION: 1. Intra and extra hepatic biliary dilatation appears relatively stable when compared to prior CT scan and ultrasound. 2. 8.5 mm distal  common bile duct stone. 3. Status post cholecystectomy. 4. Normal pancreas.  Normal caliber pancreatic duct.   Electronically Signed   By: Rudie Meyer M.D.   On: 11/18/2014 08:21    Microbiology: No results found for this or any previous visit (from the past 240 hour(s)).   Labs: Basic Metabolic Panel:  Recent Labs Lab 11/18/14 0443 11/19/14 0524 11/20/14 0521 11/21/14 0451 11/22/14 0649  NA 137 142 138 140 139  K 3.6 3.7 3.4* 3.1* 3.7  CL 105 112* 103 104 103  CO2 20* 24 22 25 25   GLUCOSE 110* 94 85 86 85  BUN 14 6 7 7 11   CREATININE 0.74 0.69 0.70 0.73 0.74  CALCIUM 8.5* 8.4* 8.6* 8.9 9.0  MG  --   --   --  1.9  --    Liver Function Tests:  Recent Labs Lab 11/18/14 0443 11/19/14 0524 11/20/14 0521 11/21/14 0451 11/22/14 0649  AST 1044* 324* 179* 140* 110*  ALT 1251* 710* 535* 410* 322*  ALKPHOS 218* 193* 242* 277* 249*  BILITOT 3.6* 3.6* 2.6* 1.4* 1.2  PROT 5.8* 5.5* 6.0* 5.8* 6.0*  ALBUMIN 3.2* 3.0* 3.0* 3.2* 3.1*    Recent Labs Lab 11/17/14 1130  LIPASE 42   No results for input(s): AMMONIA in the last 168 hours. CBC:  Recent Labs Lab 11/17/14 1130 11/18/14 0443 11/19/14 0524 11/20/14 0521 11/21/14 0451  WBC 6.6 11.7* 3.5* 3.5* 3.6*  NEUTROABS 5.0  --   --   --   --   HGB 12.3 11.7* 10.3* 11.4* 11.7*  HCT 37.2 34.1* 31.0* 34.0* 35.2*  MCV 85.5 83.8 86.6 84.6 83.8  PLT 157 138* 106* 129* 146*   Cardiac Enzymes:  Recent Labs Lab 11/17/14 1130 11/17/14 1609  TROPONINI <0.03 <0.03   BNP: BNP (last 3 results) No results for input(s): BNP in the last 8760 hours.  ProBNP (last 3 results) No results for input(s): PROBNP in the last 8760 hours.  CBG:  Recent Labs Lab 11/20/14 2211 11/21/14 0750 11/21/14 1155 11/21/14 1653 11/21/14 2226  GLUCAP 91 91 103* 104* 105*       Signed:  Prescious Hurless L  [facility] 11/22/2014, 9:53 AM

## 2014-11-22 NOTE — Care Management Note (Signed)
Case Management Note  Patient Details  Name: Emberley Kral MRN: [id number] Date of Birth: November 13, 1950  Subjective/Objective:                  Date-11-18-14 Initial Assessment   Spoke with patient at the bedside along with husband Zennon Alleva [phone].  Introduced self as Sports coach and explained role in discharge planning and how to be reached.  Verified patient lives at [facility] [address]. 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  Verified patient anticipates to go home with spouse, at time of discharge and will have full- time supervision by family this time to best of their knowledge.  Patient has DME. Expressed potential need for no other DME.  Patient denied needing help with their medication.  Patient drives to MD appointments.  Verified patient has PCP in [place], and plan to return back later this month. Dr Julien Nordmann in [place], [place].    Action/Plan:  11-22-14- Discharge to home with husband. Patient independent, No CM needs identified.   Expected Discharge Date:  11/19/14               Expected Discharge Plan:  Home/Self Care  In-House Referral:     Discharge planning Services  CM Consult  Post Acute Care Choice:    Choice offered to:     DME Arranged:    DME Agency:     HH Arranged:    HH Agency:     Status of Service:  Completed, signed off  Medicare Important Message Given:    Date Medicare IM Given:    Medicare IM give by:    Date Additional Medicare IM Given:    Additional Medicare Important Message give by:     If discussed at Long Length of Stay Meetings, dates discussed:    Additional Comments:  Lawerance Sabal, RN 11/22/2014, 10:44 AM

## 2014-11-22 NOTE — Progress Notes (Signed)
To Whom It May Concern:    Ms. Norma Walton is unable to work 11/17/14 through 11/27/14 due to illness.   Sincerely,     Crista Curb, MD [facility]

## 2014-11-22 NOTE — Progress Notes (Signed)
Norma Walton, Norma Walton to be D/C'd Home per MD order. Discussed with the patient and all questions fully answered.  VSS, Skin clean, dry and intact without evidence of skin break down, no evidence of skin tears noted. IV catheter discontinued intact. Site without signs and symptoms of complications. Dressing and pressure applied.  An After Visit Summary was printed and given to the patient. Prescriptions given to patient.  D/c education completed with patient/family including follow up instructions, medication list, d/c activities limitations if indicated, with other d/c instructions as indicated by MD - patient able to verbalize understanding, all questions fully answered.   Patient instructed to return to ED, call 911, or call MD for any changes in condition.   Patient escorted via wheelchair with volunteers, and D/C home via private auto with significant other.

## 2014-11-22 NOTE — Progress Notes (Signed)
Patient complaining of being bloated, offered to ambulate, pt declined.  Patient refused informing provider on call, prefers to wait for the AM rounds. Will continue to monitor and evaluate.

## 2016-04-19 IMAGING — MR MR ABDOMEN WO/W CM MRCP
12 of 21 series · 21 of 48 positions shown · IV contrast (multihance)
Comparison: Ultrasound 11/17/2014 and CT scan 09/08/2014.

CLINICAL DATA: Severe epigastric abdominal pain this morning.
History of prior cholecystectomy. Recent ultrasound examination
showing biliary dilatation.

EXAM:
MRI ABDOMEN WITHOUT AND WITH CONTRAST (INCLUDING MRCP)
TECHNIQUE: Multiplanar multisequence MR imaging of the abdomen was performed
both before and after the administration of intravenous contrast.
Heavily T2-weighted images of the biliary and pancreatic ducts were
obtained, and three-dimensional MRCP images were rendered by post
processing.
CONTRAST:  14mL MULTIHANCE GADOBENATE DIMEGLUMINE 529 MG/ML IV SOLN

[Series 5: T2 fat-sat · axial · 5.0mm · 0.78mm/px · 1 of 40 slices shown]
[im 1/40]
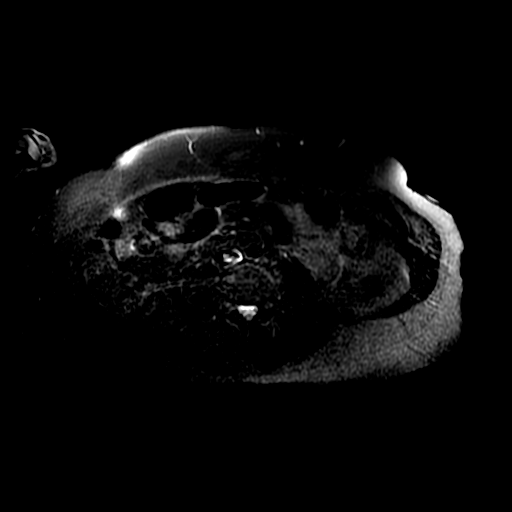

[Series 6: T2 · axial · 5.0mm · 0.74mm/px · 1 of 40 slices shown (1 of 2)]
[im 1/40]
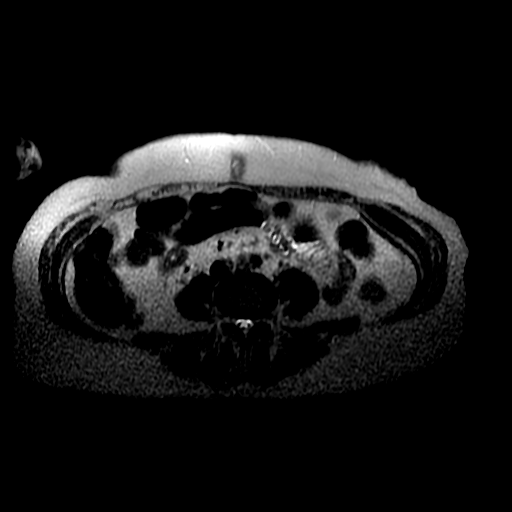

[Series 7: T2 · coronal · 5.0mm · 0.74mm/px · 1 of 41 slices shown (2 of 2)]
[im 1/41]
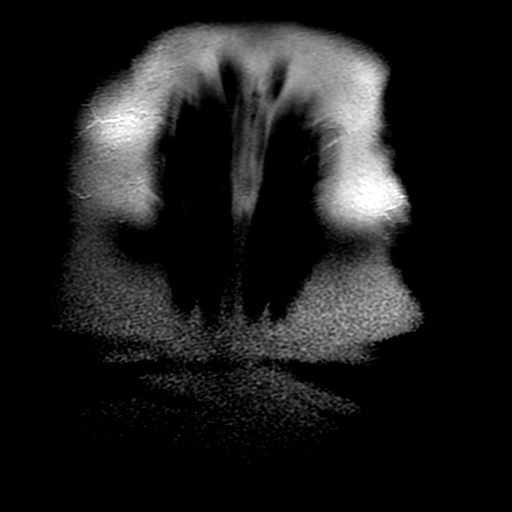

[Series 9: MRCP · coronal · 2.0mm · 0.70mm/px · 1 of 40 slices shown (1 of 2)]
[im 1/40]
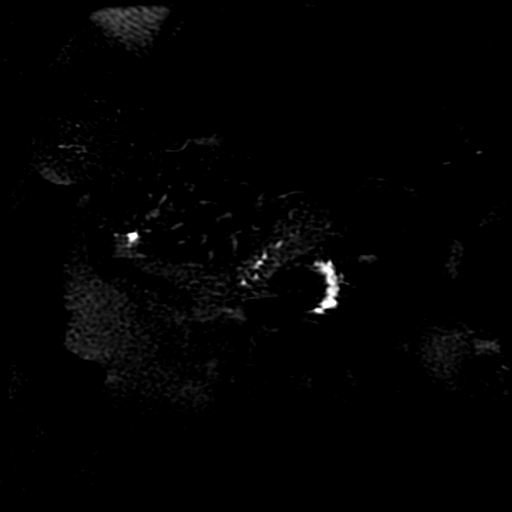

[Series 10: DWI b500 · axial · 5.0mm · 1.48mm/px · z∈[+53,+247]mm · 3 of 79 slices shown]
[im 1/79]
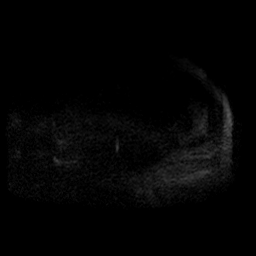
[im 40/79]
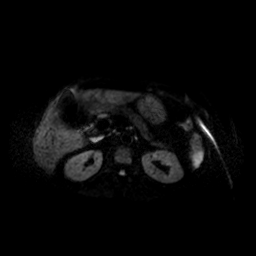
[im 79/79]
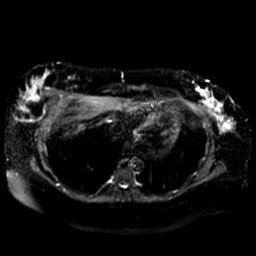

[Series 13: ax dualecho · axial · 5.0mm · 0.74mm/px · z∈[+58,+252]mm · 3 of 80 slices shown]
[im 1/80]
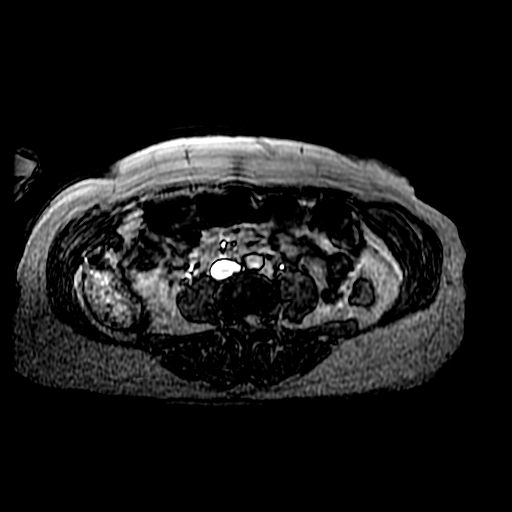
[im 40/80]
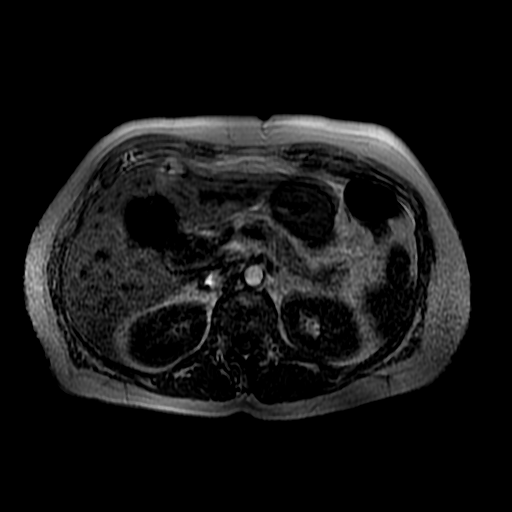
[im 80/80]
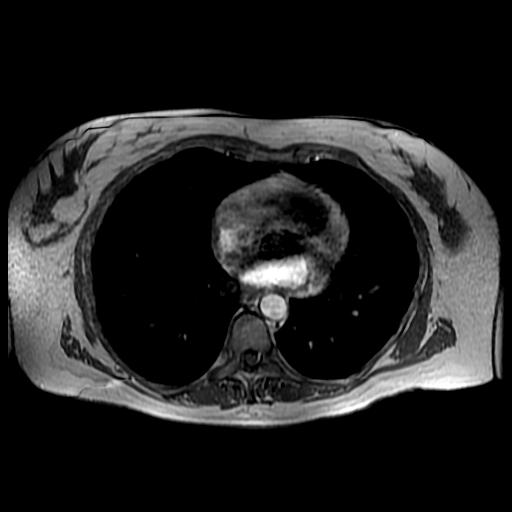

[Series 14: MRCP · sagittal · 40.0mm · 0.70mm/px · 1 of 12 slices shown (2 of 2)]
[im 1/12]
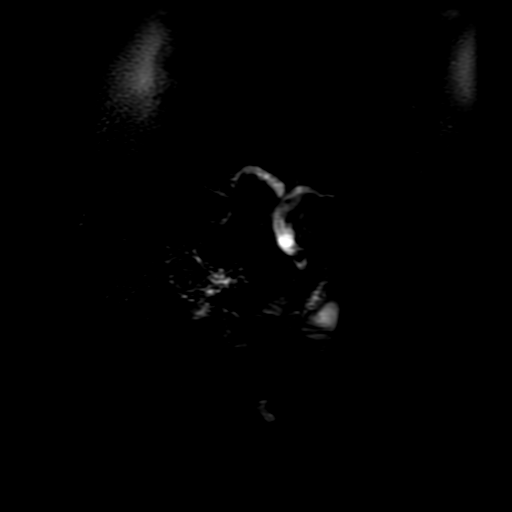

[Series 19: T1 dynamic post-contrast · coronal · 5.0mm · 0.72mm/px · 3 of 80 slices shown]
[im 1/80]
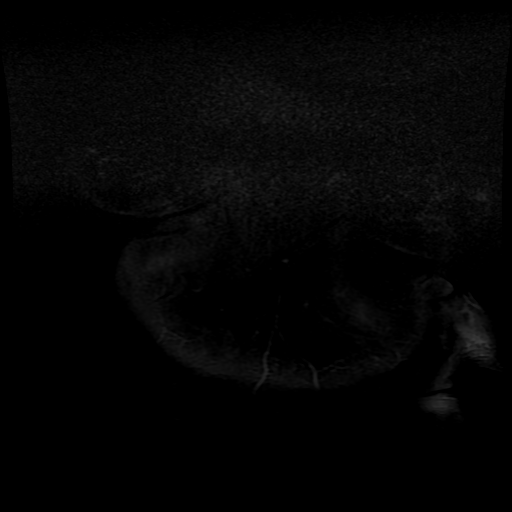
[im 40/80]
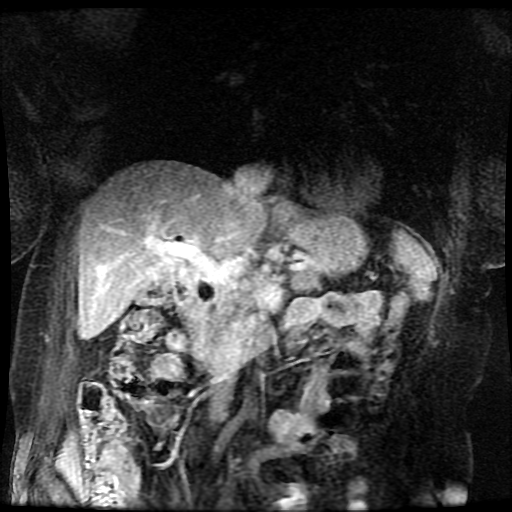
[im 80/80]
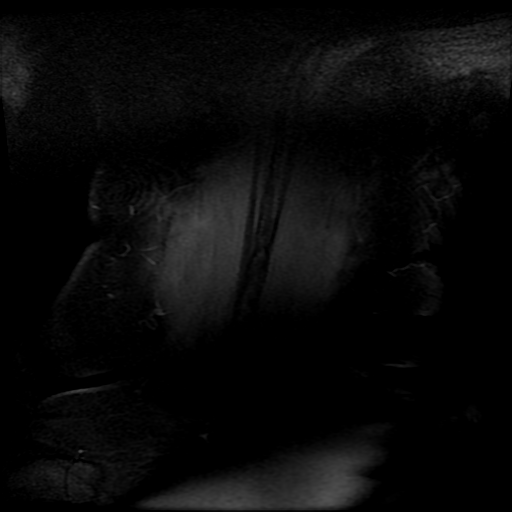

[Series 801: processed images · sagittal · 1.6mm · 0.62mm/px · 1 of 3 slices shown]
[im 1/3]
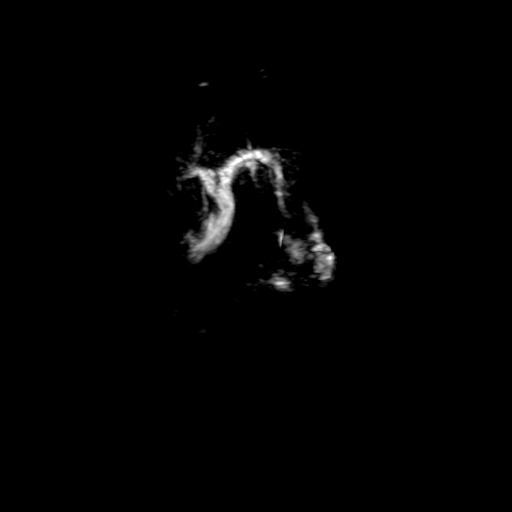

[Series 1000: DWI · axial · 5.0mm · 1.48mm/px · 1 of 39 slices shown]
[im 1/39]
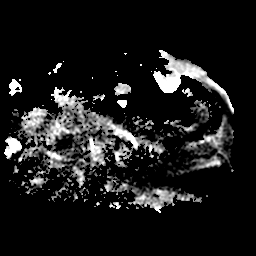

[Series 1500: T1 dynamic · axial · 5.0mm · 0.74mm/px · z∈[+41,+258]mm · 3 of 88 slices shown (1 of 2)]
[im 1/88]
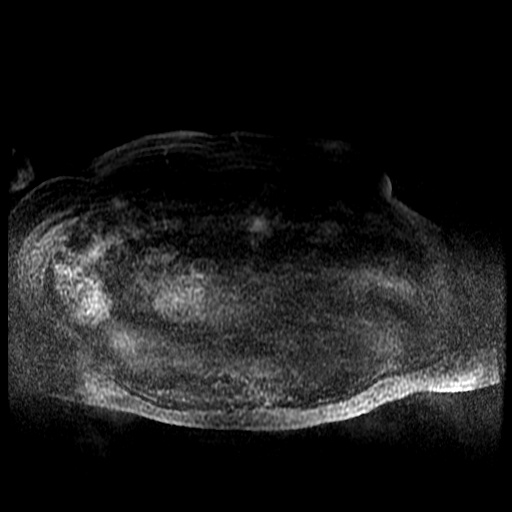
[im 44/88]
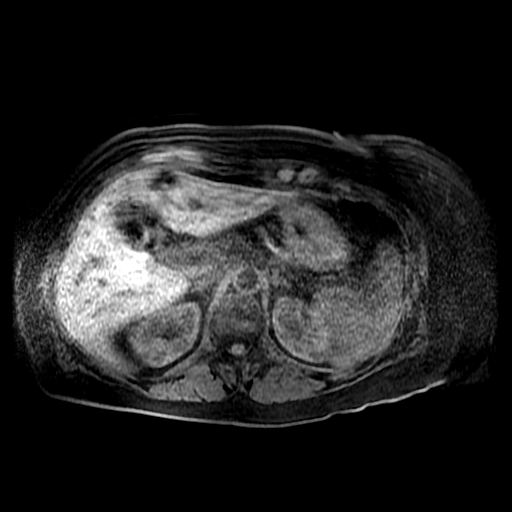
[im 88/88]
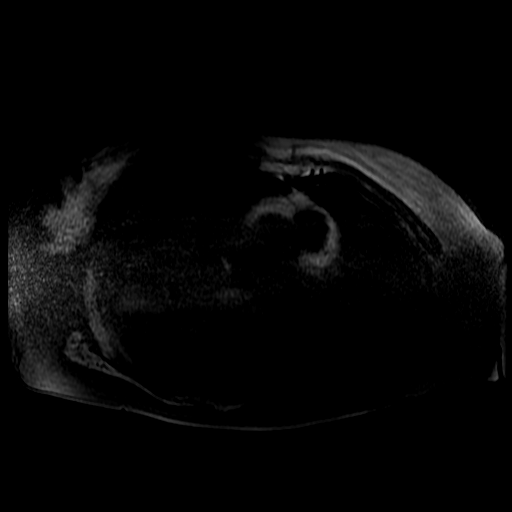

[Series 1800: T1 dynamic · axial · 5.0mm · 0.74mm/px · z∈[+92,+197]mm · 2 of 88 slices shown (2 of 2)]
[im 1/88]
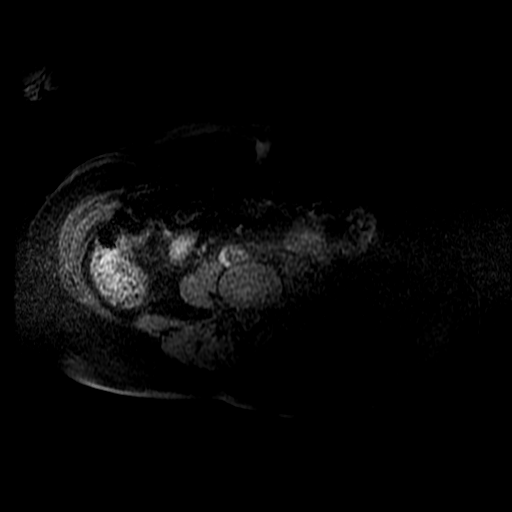
[im 44/88]
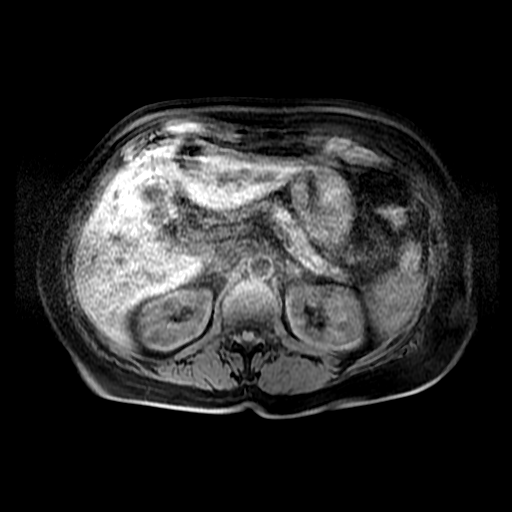

[21 of 48 positions shown; findings below may reference images not displayed]

FINDINGS: Lower chest: The lung bases are grossly clear. No obvious infiltrate
or effusion. No pericardial effusion.

Hepatobiliary: No focal hepatic lesions or intrahepatic biliary
dilatation. Stable intrahepatic biliary dilatation. Stable common
bile duct dilatation. The common bile duct measures 12 mm in the
head of the pancreas. There is a distal common bile duct stone
measuring 8.5 mm.

Pancreas: No mass, inflammation or pancreatic ductal dilatation.

Spleen: Normal size.  No focal lesions.

Adrenals/Urinary Tract: The adrenal glands and kidneys are
unremarkable. There is a simple left renal cyst.

Stomach/Bowel: The stomach, duodenum, visualized small bowel and
visualized colon are unremarkable. No inflammatory changes, mass
lesions or obstructive findings.

Vascular/Lymphatic: The aorta and branch vessels are patent.
Scattered atherosclerotic calcifications the major venous structures
are patent.

Other: No abdominal wall hernia, ascites or subcutaneous lesions.

Musculoskeletal: No significant bony findings.
IMPRESSION: 1. Intra and extra hepatic biliary dilatation appears relatively
stable when compared to prior CT scan and ultrasound.
2. 8.5 mm distal common bile duct stone.
3. Status post cholecystectomy.
4. Normal pancreas.  Normal caliber pancreatic duct.

## 2016-04-20 IMAGING — RF DG ERCP WO/W SPHINCTEROTOMY
1 series · 5 of 5 positions shown · non-contrast
Comparison: None.

CLINICAL DATA: 64-year-old female with a history of sphincterotomy,
choledocholithiasis

EXAM:
ERCP
TECHNIQUE: Multiple spot images obtained with the fluoroscopic device and
submitted for interpretation post-procedure.
FLUOROSCOPY TIME:  Fluoroscopy Time:  4 minutes 21 seconds

[Series 1: run · 5 of 5 slices shown]
[im 1/5]
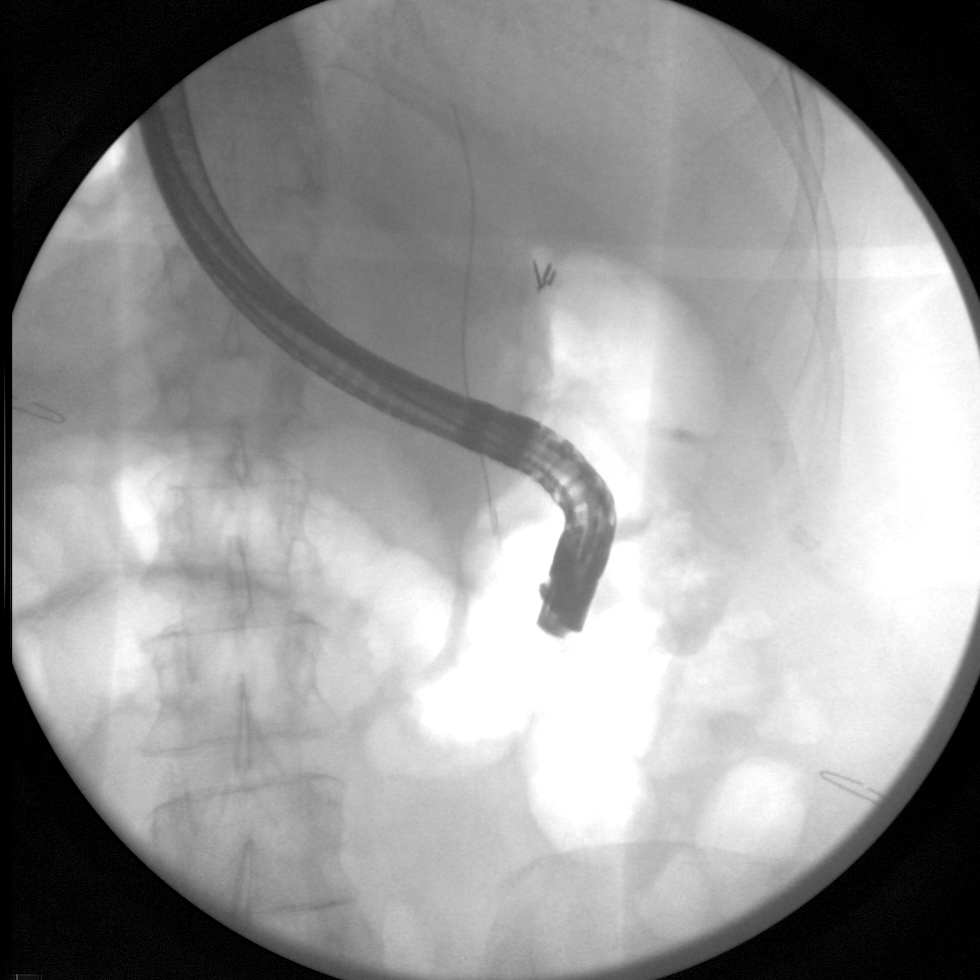
[im 2/5]
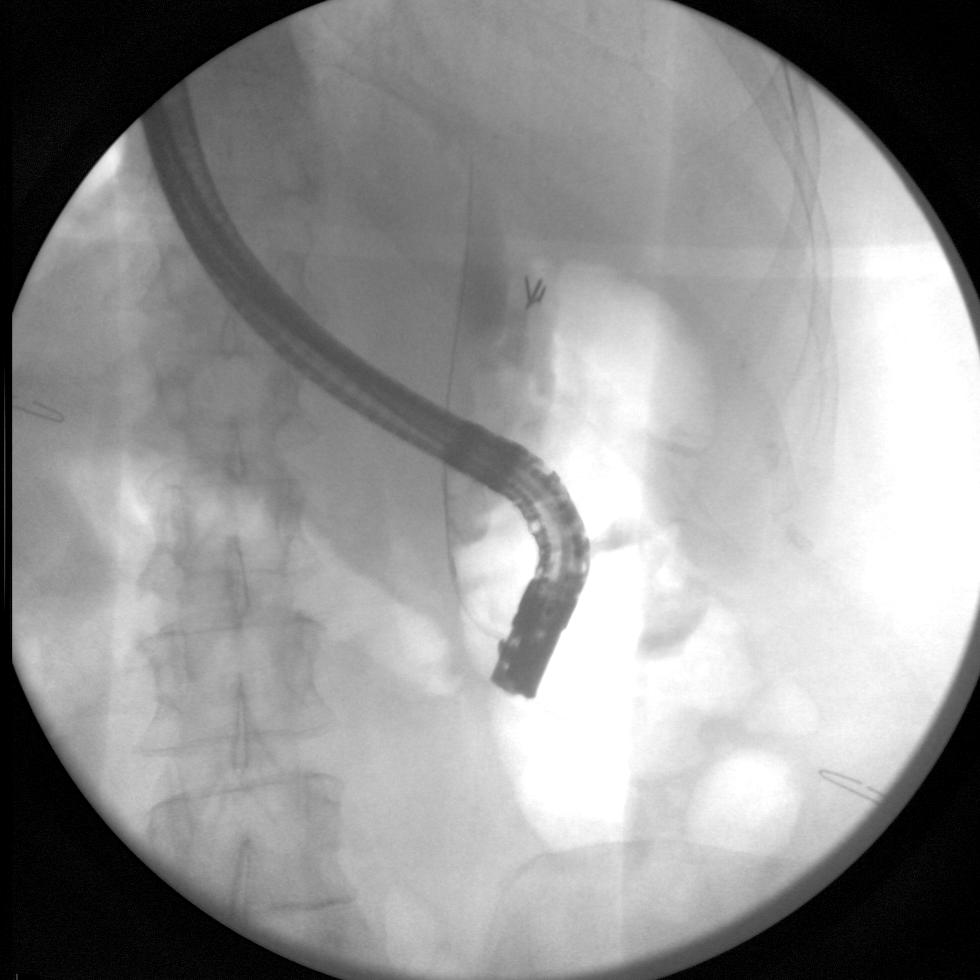
[im 3/5]
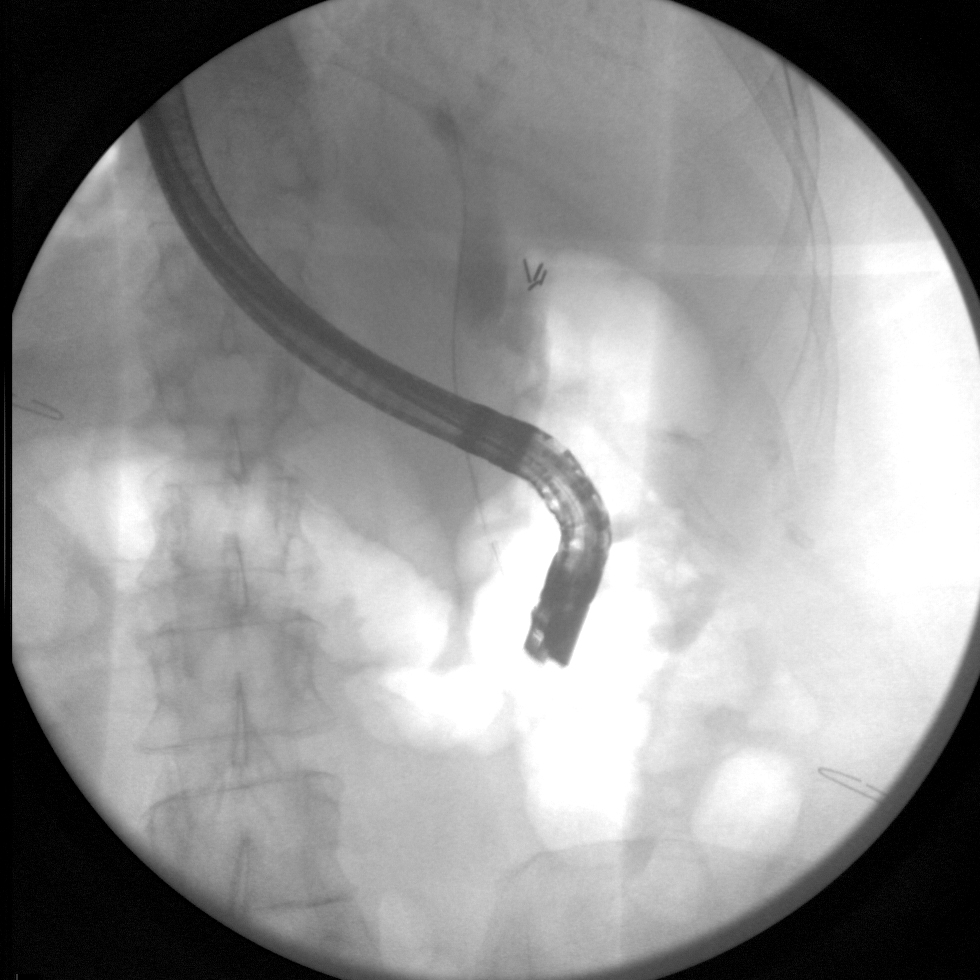
[im 4/5]
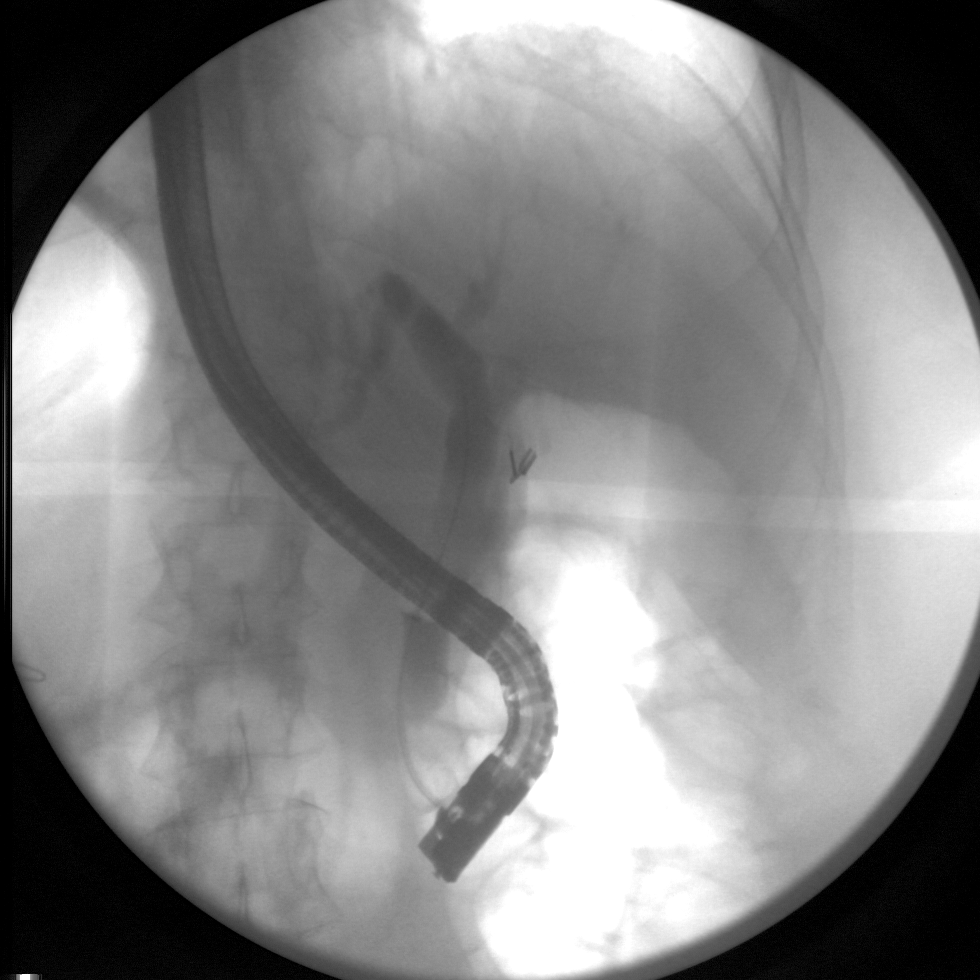
[im 5/5]
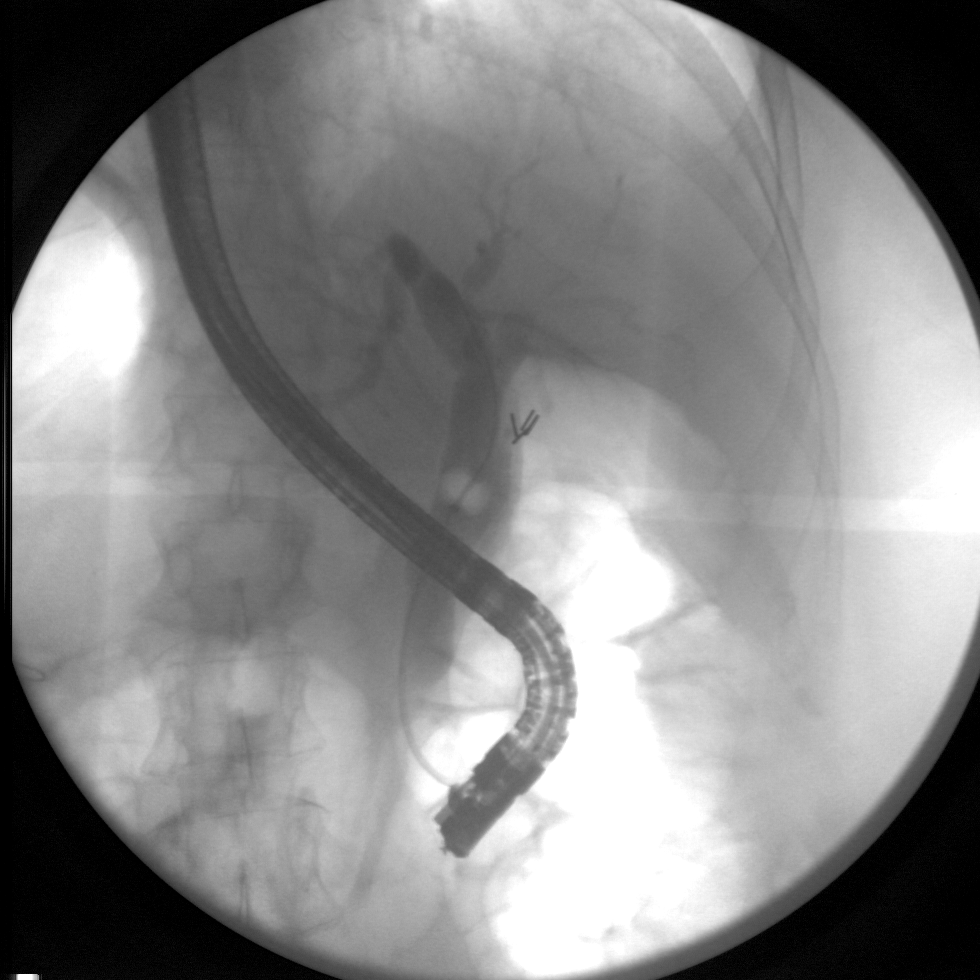

[5 of 5 positions shown; findings below may reference images not displayed]

FINDINGS: Initial images demonstrate endoscope within the upper abdomen and
cannulation of the ampulla.

Subsequently there is retrograde infusion of contrast with partial
opacification of the extrahepatic biliary system. Surgical changes
of cholecystectomy are present.

Balloon basket present within the biliary system.
IMPRESSION: Limited images during ERCP demonstrate balloon basket within a
partially opacified extrahepatic biliary system.

Changes of cholecystectomy.

Please refer to the dictated operative report for full details of
intraoperative findings and procedure.
